# Patient Record
Sex: Female | Born: 1985 | Race: White | Hispanic: No | Marital: Married | Smoking: Current every day smoker
Health system: Southern US, Community
[De-identification: ages and names within clinical notes are randomized; demographics above are authoritative.]

## PROBLEM LIST (undated history)

## (undated) DIAGNOSIS — F988 Other specified behavioral and emotional disorders with onset usually occurring in childhood and adolescence: Secondary | ICD-10-CM

## (undated) DIAGNOSIS — Z309 Encounter for contraceptive management, unspecified: Secondary | ICD-10-CM

## (undated) DIAGNOSIS — F419 Anxiety disorder, unspecified: Secondary | ICD-10-CM

## (undated) HISTORY — DX: Encounter for contraceptive management, unspecified: Z30.9

## (undated) HISTORY — DX: Anxiety disorder, unspecified: F41.9

## (undated) HISTORY — DX: Other specified behavioral and emotional disorders with onset usually occurring in childhood and adolescence: F98.8

---
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
## 2005-08-21 ENCOUNTER — Inpatient Hospital Stay (HOSPITAL_COMMUNITY): Admission: AD | Admit: 2005-08-21 | Discharge: 2005-08-24 | Payer: Self-pay | Admitting: Obstetrics and Gynecology

## 2007-03-06 ENCOUNTER — Other Ambulatory Visit: Admission: RE | Admit: 2007-03-06 | Discharge: 2007-03-06 | Payer: Self-pay | Admitting: Obstetrics and Gynecology

## 2008-12-28 ENCOUNTER — Other Ambulatory Visit: Admission: RE | Admit: 2008-12-28 | Discharge: 2008-12-28 | Payer: Self-pay | Admitting: Obstetrics & Gynecology

## 2010-02-14 ENCOUNTER — Other Ambulatory Visit
Admission: RE | Admit: 2010-02-14 | Discharge: 2010-02-14 | Payer: Self-pay | Source: Home / Self Care | Admitting: Obstetrics & Gynecology
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
## 2010-06-17 NOTE — Group Therapy Note (Signed)
NAME:  NYLAH, BUTKUS NO.:  [id number]   MEDICAL RECORD NO.:  [id number]          PATIENT TYPE:  INP   LOCATION:  LDR1                          FACILITY:  [facility]   PHYSICIAN:  Lazaro Arms, M.D.   DATE OF BIRTH:  1986/01/09   DATE OF PROCEDURE:  08/22/2005  DATE OF DISCHARGE:                                   PROGRESS NOTE   DELIVERY NOTE:  Sameena's second epidural worked Agricultural consultant, and she was allowed to labor  down for quite some time.  She was noted to be at +3 station at  approximately 1630.  She brought the baby to about 50 cent worth showing  caput; however, she was unable to move it any more after that, just purely  lack of effort due to the epidural.  A vacuum extractor was offered and  accepted.  It was placed on the fetal vertex and after three contractions,  the baby delivered over a midline episiotomy.  The shoulders delivered  without difficulty.  There was a loose nuchal cord, which was not reduced.  Weight is 7 pounds 8 ounces.  Apgars are 9 and 9.  The birth time is 68.  Twenty units of Pitocin diluted in 1000 mL of lactated Ringer's was infused  rapidly IV.  The placenta separated spontaneously and delivered via  controlled cord traction and maternal pushing effort at 1708.  It was  inspected and appears to be intact with a three-vessel cord.  The vagina was  then inspected and no extensions or other lacerations were noted.  Xylocaine  1% 10 mL was injected into the very shallow midline episiotomy and it was  repaired using 2-0 Vicryl suture.  The epidural catheter was then removed  with the blue tip visualized as being intact.  This epidural was in from  about 1:30 p.m. until delivery.  Estimated blood loss 300 mL.      Jacklyn Shell, C.N.M.      Lazaro Arms, M.D.  Electronically Signed    FC/MEDQ  D:  08/22/2005  T:  08/23/2005  Job:  [id number]   cc:   [facility]
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
## 2010-06-17 NOTE — Op Note (Signed)
NAME:  Amy Hardin, Amy Hardin NO.:  [id number]   MEDICAL RECORD NO.:  [id number]          PATIENT TYPE:  INP   LOCATION:  LDR1                          FACILITY:  [facility]   PHYSICIAN:  Tilda Burrow, M.D. DATE OF BIRTH:  08/17/1985   DATE OF PROCEDURE:  08/22/2005  DATE OF DISCHARGE:                                 OPERATIVE REPORT   PROCEDURE PERFORMED:  Epidural catheter placement.   Continuous lumbar epidural catheter placed using loss of resistance  technique at L2-3 interspace.  The patient's back was prepped and draped in  standard fashion.  The epidural catheter inserted without difficulty.  The  patient had epidural placed at L2-3 interspace without difficulty.  The  patient had good analgesic effect.  The patient began to have symmetric  analgesic effect and will receive a 7 mL bolus and 14 mL per hour.      Tilda Burrow, M.D.  Electronically Signed     JVF/MEDQ  D:  08/22/2005  T:  08/22/2005  Job:  [id number]
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
## 2011-03-29 ENCOUNTER — Other Ambulatory Visit (HOSPITAL_COMMUNITY)
Admission: RE | Admit: 2011-03-29 | Discharge: 2011-03-29 | Disposition: A | Discharge status: Home / Self Care | Payer: BC Managed Care – PPO | Source: Ambulatory Visit | Attending: Obstetrics & Gynecology | Admitting: Obstetrics & Gynecology

## 2011-03-29 DIAGNOSIS — Z01419 Encounter for gynecological examination (general) (routine) without abnormal findings: Secondary | ICD-10-CM | POA: Insufficient documentation

## 2012-06-03 ENCOUNTER — Other Ambulatory Visit: Payer: Self-pay | Admitting: *Deleted

## 2012-06-04 MED ORDER — NORGESTIMATE-ETH ESTRADIOL 0.25-35 MG-MCG PO TABS: 1.0000 | ORAL_TABLET | Freq: Every day | ORAL | Status: DC

## 2012-06-05 NOTE — Telephone Encounter (Addendum)
Pt informed RX for Mononessa e-scribed to pharmacy.

## 2012-07-04 ENCOUNTER — Encounter: Payer: Self-pay | Admitting: Obstetrics & Gynecology

## 2012-07-04 ENCOUNTER — Ambulatory Visit (INDEPENDENT_AMBULATORY_CARE_PROVIDER_SITE_OTHER): Payer: BC Managed Care – PPO | Admitting: Obstetrics & Gynecology

## 2012-07-04 VITALS — BP 100/60 | Ht 63.0 in | Wt 104.0 lb

## 2012-07-04 DIAGNOSIS — Z01419 Encounter for gynecological examination (general) (routine) without abnormal findings: Secondary | ICD-10-CM

## 2012-07-04 MED ORDER — NORGESTIMATE-ETH ESTRADIOL 0.25-35 MG-MCG PO TABS: 1.0000 | ORAL_TABLET | Freq: Every day | ORAL | Status: DC

## 2012-07-04 NOTE — Progress Notes (Signed)
Patient ID: Amy Hardin, female   DOB: 06-20-1985, 27 y.o.   MRN: [id number] Subjective:     Amy Hardin is a 27 y.o. female here for a routine exam.  Patient's last menstrual period was 06/23/2012. No obstetric history on file. Current complaints: none.  Personal health questionnaire reviewed: no.   Gynecologic History Patient's last menstrual period was 06/23/2012. Contraception: OCP (estrogen/progesterone) Last Pap: 2013. Results were: normal Last mammogram: na. Results were: na  Obstetric History OB History   Grav Para Term Preterm Abortions TAB SAB Ect Mult Living                   The following portions of the patient's history were reviewed and updated as appropriate: allergies, current medications, past family history, past medical history, past social history, past surgical history and problem list.  Review of Systems  Review of Systems  Constitutional: Negative for fever, chills, weight loss, malaise/fatigue and diaphoresis.  HENT: Negative for hearing loss, ear pain, nosebleeds, congestion, sore throat, neck pain, tinnitus and ear discharge.   Eyes: Negative for blurred vision, double vision, photophobia, pain, discharge and redness.  Respiratory: Negative for cough, hemoptysis, sputum production, shortness of breath, wheezing and stridor.   Cardiovascular: Negative for chest pain, palpitations, orthopnea, claudication, leg swelling and PND.  Gastrointestinal: negative for abdominal pain. Negative for heartburn, nausea, vomiting, diarrhea, constipation, blood in stool and melena.  Genitourinary: Negative for dysuria, urgency, frequency, hematuria and flank pain.  Musculoskeletal: Negative for myalgias, back pain, joint pain and falls.  Skin: Negative for itching and rash.  Neurological: Negative for dizziness, tingling, tremors, sensory change, speech change, focal weakness, seizures, loss of consciousness, weakness and headaches.  Endo/Heme/Allergies: Negative  for environmental allergies and polydipsia. Does not bruise/bleed easily.  Psychiatric/Behavioral: Negative for depression, suicidal ideas, hallucinations, memory loss and substance abuse. The patient is not nervous/anxious and does not have insomnia.        Objective:    Physical Exam  Vitals reviewed. Constitutional: She is oriented to person, place, and time. She appears well-developed and well-nourished.  HENT:  Head: Normocephalic and atraumatic.        Right Ear: External ear normal.  Left Ear: External ear normal.  Nose: Nose normal.  Mouth/Throat: Oropharynx is clear and moist.  Eyes: Conjunctivae and EOM are normal. Pupils are equal, round, and reactive to light. Right eye exhibits no discharge. Left eye exhibits no discharge. No scleral icterus.  Neck: Normal range of motion. Neck supple. No tracheal deviation present. No thyromegaly present.  Cardiovascular: Normal rate, regular rhythm, normal heart sounds and intact distal pulses.  Exam reveals no gallop and no friction rub.   No murmur heard. Respiratory: Effort normal and breath sounds normal. No respiratory distress. She has no wheezes. She has no rales. She exhibits no tenderness.  GI: Soft. Bowel sounds are normal. She exhibits no distension and no mass. There is no tenderness. There is no rebound and no guarding.  Genitourinary:       Vulva is normal without lesions Vagina is pink moist without discharge Cervix normal in appearance and pap is done Uterus is normal size shape and contour Adnexa is negative with normal sized ovaries   Musculoskeletal: Normal range of motion. She exhibits no edema and no tenderness.  Neurological: She is alert and oriented to person, place, and time. She has normal reflexes. She displays normal reflexes. No cranial nerve deficit. She exhibits normal muscle tone. Coordination normal.  Skin: Skin  is warm and dry. No rash noted. No erythema. No pallor.  Psychiatric: She has a normal mood  and affect. Her behavior is normal. Judgment and thought content normal.       Assessment:    Healthy female exam.    Plan:    Contraception: OCP (estrogen/progesterone). Follow up in: 1 year.

## 2013-08-26 ENCOUNTER — Other Ambulatory Visit: Payer: Self-pay | Admitting: Obstetrics & Gynecology

## 2013-09-02 ENCOUNTER — Ambulatory Visit (INDEPENDENT_AMBULATORY_CARE_PROVIDER_SITE_OTHER): Payer: BC Managed Care – PPO | Admitting: Obstetrics & Gynecology

## 2013-09-02 ENCOUNTER — Encounter: Payer: Self-pay | Admitting: Obstetrics & Gynecology

## 2013-09-02 VITALS — BP 100/70 | Ht 63.2 in | Wt 105.0 lb

## 2013-09-02 DIAGNOSIS — Z01419 Encounter for gynecological examination (general) (routine) without abnormal findings: Secondary | ICD-10-CM

## 2013-09-02 MED ORDER — ALPRAZOLAM 0.5 MG PO TABS: 0.5000 mg | ORAL_TABLET | Freq: Three times a day (TID) | ORAL | Status: DC | PRN

## 2013-09-02 MED ORDER — NORGESTIMATE-ETH ESTRADIOL 0.25-35 MG-MCG PO TABS: ORAL_TABLET | ORAL | Status: DC

## 2013-09-02 NOTE — Progress Notes (Signed)
Patient ID: Amy PrimasBrianne D Hardin, female   DOB: September 23, 1985, 28 y.o.   MRN: [id number] Subjective:     Amy PrimasBrianne D Hardin is a 28 y.o. female here for a routine exam.  Patient's last menstrual period was 08/19/2013. No obstetric history on file. Birth Control Method:  OCP Menstrual Calendar(currently): regular  Current complaints: none.   Current acute medical issues:  none   Recent Gynecologic History Patient's last menstrual period was 08/19/2013. Last Pap: 2014,  normal Last mammogram: ,  normal  Past Medical History  Diagnosis Date  . Anxiety     History reviewed. No pertinent past surgical history.  OB History   Grav Para Term Preterm Abortions TAB SAB Ect Mult Living                  History   Social History  . Marital Status: Single    Spouse Name: N/A    Number of Children: N/A  . Years of Education: N/A   Social History Main Topics  . Smoking status: Current Every Day Smoker    Types: Cigarettes  . Smokeless tobacco: None  . Alcohol Use: None  . Drug Use: None  . Sexual Activity: Yes   Other Topics Concern  . None   Social History Narrative  . None    Family History  Problem Relation Age of Onset  . Diabetes Other   . Hypertension Other   . Heart disease Other      Review of Systems  Review of Systems  Constitutional: Negative for fever, chills, weight loss, malaise/fatigue and diaphoresis.  HENT: Negative for hearing loss, ear pain, nosebleeds, congestion, sore throat, neck pain, tinnitus and ear discharge.   Eyes: Negative for blurred vision, double vision, photophobia, pain, discharge and redness.  Respiratory: Negative for cough, hemoptysis, sputum production, shortness of breath, wheezing and stridor.   Cardiovascular: Negative for chest pain, palpitations, orthopnea, claudication, leg swelling and PND.  Gastrointestinal: negative for abdominal pain. Negative for heartburn, nausea, vomiting, diarrhea, constipation, blood in stool and melena.   Genitourinary: Negative for dysuria, urgency, frequency, hematuria and flank pain.  Musculoskeletal: Negative for myalgias, back pain, joint pain and falls.  Skin: Negative for itching and rash.  Neurological: Negative for dizziness, tingling, tremors, sensory change, speech change, focal weakness, seizures, loss of consciousness, weakness and headaches.  Endo/Heme/Allergies: Negative for environmental allergies and polydipsia. Does not bruise/bleed easily.  Psychiatric/Behavioral: Negative for depression, suicidal ideas, hallucinations, memory loss and substance abuse. The patient is not nervous/anxious and does not have insomnia.        Objective:    Physical Exam  Vitals reviewed. Constitutional: She is oriented to person, place, and time. She appears well-developed and well-nourished.  HENT:  Head: Normocephalic and atraumatic.        Right Ear: External ear normal.  Left Ear: External ear normal.  Nose: Nose normal.  Mouth/Throat: Oropharynx is clear and moist.  Eyes: Conjunctivae and EOM are normal. Pupils are equal, round, and reactive to light. Right eye exhibits no discharge. Left eye exhibits no discharge. No scleral icterus.  Neck: Normal range of motion. Neck supple. No tracheal deviation present. No thyromegaly present.  Cardiovascular: Normal rate, regular rhythm, normal heart sounds and intact distal pulses.  Exam reveals no gallop and no friction rub.   No murmur heard. Respiratory: Effort normal and breath sounds normal. No respiratory distress. She has no wheezes. She has no rales. She exhibits no tenderness.  GI: Soft. Bowel sounds are  normal. She exhibits no distension and no mass. There is no tenderness. There is no rebound and no guarding.  Genitourinary:  Breasts no masses skin changes or nipple changes bilaterally      Vulva is normal without lesions Vagina is pink moist without discharge Cervix normal in appearance and pap is done Uterus is normal size shape  and contour Adnexa is negative with normal sized ovaries    Musculoskeletal: Normal range of motion. She exhibits no edema and no tenderness.  Neurological: She is alert and oriented to person, place, and time. She has normal reflexes. She displays normal reflexes. No cranial nerve deficit. She exhibits normal muscle tone. Coordination normal.  Skin: Skin is warm and dry. No rash noted. No erythema. No pallor.  Psychiatric: She has a normal mood and affect. Her behavior is normal. Judgment and thought content normal.       Assessment:    Healthy female exam.    Plan:    Contraception: OCP (estrogen/progesterone). Follow up in: 1 year.

## 2013-09-02 NOTE — Addendum Note (Signed)
Addended by: Criss AlvinePULLIAM, CHRYSTAL G on: 09/02/2013 04:26 PM   Modules accepted: Orders

## 2013-09-03 ENCOUNTER — Other Ambulatory Visit (HOSPITAL_COMMUNITY)
Admission: RE | Admit: 2013-09-03 | Discharge: 2013-09-03 | Disposition: A | Discharge status: Home / Self Care | Payer: BC Managed Care – PPO | Source: Ambulatory Visit | Attending: Obstetrics & Gynecology | Admitting: Obstetrics & Gynecology

## 2013-09-03 DIAGNOSIS — Z01419 Encounter for gynecological examination (general) (routine) without abnormal findings: Secondary | ICD-10-CM | POA: Insufficient documentation

## 2013-09-04 LAB — CYTOLOGY - PAP

## 2014-09-22 ENCOUNTER — Other Ambulatory Visit: Payer: Self-pay | Admitting: Obstetrics & Gynecology

## 2014-09-29 ENCOUNTER — Encounter: Payer: Self-pay | Admitting: Adult Health

## 2014-09-29 ENCOUNTER — Ambulatory Visit (INDEPENDENT_AMBULATORY_CARE_PROVIDER_SITE_OTHER): Payer: BLUE CROSS/BLUE SHIELD | Admitting: Adult Health

## 2014-09-29 VITALS — BP 104/70 | HR 68 | Ht 63.5 in | Wt 104.5 lb

## 2014-09-29 DIAGNOSIS — Z309 Encounter for contraceptive management, unspecified: Secondary | ICD-10-CM

## 2014-09-29 DIAGNOSIS — Z3041 Encounter for surveillance of contraceptive pills: Secondary | ICD-10-CM

## 2014-09-29 DIAGNOSIS — Z01419 Encounter for gynecological examination (general) (routine) without abnormal findings: Secondary | ICD-10-CM | POA: Diagnosis not present

## 2014-09-29 HISTORY — DX: Encounter for contraceptive management, unspecified: Z30.9

## 2014-09-29 MED ORDER — NORGESTIMATE-ETH ESTRADIOL 0.25-35 MG-MCG PO TABS: ORAL_TABLET | ORAL | Status: DC

## 2014-09-29 NOTE — Patient Instructions (Signed)
Pap and physical in 1 year Mammogram at 40   

## 2014-09-29 NOTE — Progress Notes (Signed)
Patient ID: Amy Hardin, female   DOB: January 11, 1986, 29 y.o.   MRN: [id number] History of Present Illness: Amy Hardin is a 29 year old white female in for a well woman gyn exam, her last pap was 09/02/13 and was normal.She is happy with her pills and has no complaints.   Current Medications, Allergies, Past Medical History, Past Surgical History, Family History and Social History were reviewed in [employer] record.     Review of Systems: Patient denies any headaches, hearing loss, fatigue, blurred vision, shortness of breath, chest pain, abdominal pain, problems with bowel movements, urination, or intercourse. No joint pain or mood swings.   Physical Exam:BP 104/70 mmHg  Pulse 68  Ht 5' 3.5" (1.613 m)  Wt 104 lb 8 oz (47.401 kg)  BMI 18.22 kg/m2  LMP 09/22/2014 General:  Well developed, well nourished, no acute distress Skin:  Warm and dry Neck:  Midline trachea, normal thyroid, good ROM, no lymphadenopathy Lungs; Clear to auscultation bilaterally Breast:  No dominant palpable mass, retraction, or nipple discharge Cardiovascular: Regular rate and rhythm Abdomen:  Soft, non tender, no hepatosplenomegaly Pelvic:  External genitalia is normal in appearance, no lesions.  The vagina is normal in appearance. Urethra has no lesions or masses. The cervix is smooth.  Uterus is felt to be normal size, shape, and contour.  No adnexal masses or tenderness noted.Bladder is non tender, no masses felt. Extremities/musculoskeletal:  No swelling or varicosities noted, no clubbing or cyanosis Psych:  No mood changes, alert and cooperative,seems happy Decrease cigarettes, she is aware can't take COC and smoke past 35 can switch to POP  Impression: Well woman gyn exam no pap Contraceptive management    Plan: Refilled Mononessa take 1 daily x 1 year Pap and physical in 1 year Mammogram at 9

## 2015-07-20 DIAGNOSIS — F419 Anxiety disorder, unspecified: Secondary | ICD-10-CM | POA: Diagnosis not present

## 2015-07-20 DIAGNOSIS — Z1389 Encounter for screening for other disorder: Secondary | ICD-10-CM | POA: Diagnosis not present

## 2015-07-20 DIAGNOSIS — Z681 Body mass index (BMI) 19 or less, adult: Secondary | ICD-10-CM | POA: Diagnosis not present

## 2015-10-05 ENCOUNTER — Ambulatory Visit (INDEPENDENT_AMBULATORY_CARE_PROVIDER_SITE_OTHER): Payer: BLUE CROSS/BLUE SHIELD | Admitting: Obstetrics & Gynecology

## 2015-10-05 ENCOUNTER — Encounter: Payer: Self-pay | Admitting: Obstetrics & Gynecology

## 2015-10-05 ENCOUNTER — Other Ambulatory Visit (HOSPITAL_COMMUNITY)
Admission: RE | Admit: 2015-10-05 | Discharge: 2015-10-05 | Disposition: A | Discharge status: Home / Self Care | Payer: BLUE CROSS/BLUE SHIELD | Source: Ambulatory Visit | Attending: Obstetrics & Gynecology | Admitting: Obstetrics & Gynecology

## 2015-10-05 VITALS — BP 100/60 | HR 64 | Ht 64.0 in | Wt 108.0 lb

## 2015-10-05 DIAGNOSIS — Z1151 Encounter for screening for human papillomavirus (HPV): Secondary | ICD-10-CM | POA: Diagnosis not present

## 2015-10-05 DIAGNOSIS — Z01419 Encounter for gynecological examination (general) (routine) without abnormal findings: Secondary | ICD-10-CM

## 2015-10-05 MED ORDER — NORGESTIMATE-ETH ESTRADIOL 0.25-35 MG-MCG PO TABS: ORAL_TABLET | ORAL | 12 refills | Status: DC

## 2015-10-05 NOTE — Progress Notes (Signed)
Subjective:     Amy PrimasBrianne D Hardin is a [age] y.o. female here for a routine exam.  Patient's last menstrual period was 09/21/2015. G1P1 Birth Control Method:  OCP Menstrual Calendar(currently): regular  Current complaints: none.   Current acute medical issues:  none   Recent Gynecologic History Patient's last menstrual period was 09/21/2015. Last Pap: 2016,  normal Last mammogram: ,    Past Medical History:  Diagnosis Date  . Anxiety   . Contraceptive management 09/29/2014    History reviewed. No pertinent surgical history.  OB History    Gravida Para Term Preterm AB Living   1 1       1    SAB TAB Ectopic Multiple Live Births                  Social History   Social History  . Marital status: Single    Spouse name: N/A  . Number of children: N/A  . Years of education: N/A   Social History Main Topics  . Smoking status: Current Every Day Smoker    Packs/day: 1.00    Years: 13.00    Types: Cigarettes  . Smokeless tobacco: Never Used  . Alcohol use No  . Drug use: No  . Sexual activity: Yes    Birth control/ protection: Pill   Other Topics Concern  . None   Social History Narrative  . None    Family History  Problem Relation Age of Onset  . Cancer Maternal Grandmother   . Cancer Maternal Grandfather   . Diabetes Maternal Grandfather   . COPD Paternal Grandmother   . Congestive Heart Failure Paternal Grandfather   . Diabetes Paternal Grandfather   . Heart disease Paternal Grandfather   . Hypertension Paternal Grandfather      Current Outpatient Prescriptions:  .  ALPRAZolam (XANAX) 0.5 MG tablet, Take 1 tablet (0.5 mg total) by mouth 3 (three) times daily as needed for anxiety., Disp: 90 tablet, Rfl: 3 .  norgestimate-ethinyl estradiol (MONONESSA) 0.25-35 MG-MCG tablet, TAKE ONE (1) TABLET EACH DAY, Disp: 1 Package, Rfl: 12  Review of Systems  Review of Systems  Constitutional: Negative for fever, chills, weight loss, malaise/fatigue and  diaphoresis.  HENT: Negative for hearing loss, ear pain, nosebleeds, congestion, sore throat, neck pain, tinnitus and ear discharge.   Eyes: Negative for blurred vision, double vision, photophobia, pain, discharge and redness.  Respiratory: Negative for cough, hemoptysis, sputum production, shortness of breath, wheezing and stridor.   Cardiovascular: Negative for chest pain, palpitations, orthopnea, claudication, leg swelling and PND.  Gastrointestinal: negative for abdominal pain. Negative for heartburn, nausea, vomiting, diarrhea, constipation, blood in stool and melena.  Genitourinary: Negative for dysuria, urgency, frequency, hematuria and flank pain.  Musculoskeletal: Negative for myalgias, back pain, joint pain and falls.  Skin: Negative for itching and rash.  Neurological: Negative for dizziness, tingling, tremors, sensory change, speech change, focal weakness, seizures, loss of consciousness, weakness and headaches.  Endo/Heme/Allergies: Negative for environmental allergies and polydipsia. Does not bruise/bleed easily.  Psychiatric/Behavioral: Negative for depression, suicidal ideas, hallucinations, memory loss and substance abuse. The patient is not nervous/anxious and does not have insomnia.        Objective:  Blood pressure 100/60, pulse 64, height 5\' 4"  (1.626 m), weight 108 lb (49 kg), last menstrual period 09/21/2015.   Physical Exam  Vitals reviewed. Constitutional: She is oriented to person, place, and time. She appears well-developed and well-nourished.  HENT:  Head: Normocephalic and atraumatic.  Right Ear: External ear normal.  Left Ear: External ear normal.  Nose: Nose normal.  Mouth/Throat: Oropharynx is clear and moist.  Eyes: Conjunctivae and EOM are normal. Pupils are equal, round, and reactive to light. Right eye exhibits no discharge. Left eye exhibits no discharge. No scleral icterus.  Neck: Normal range of motion. Neck supple. No tracheal deviation  present. No thyromegaly present.  Cardiovascular: Normal rate, regular rhythm, normal heart sounds and intact distal pulses.  Exam reveals no gallop and no friction rub.   No murmur heard. Respiratory: Effort normal and breath sounds normal. No respiratory distress. She has no wheezes. She has no rales. She exhibits no tenderness.  GI: Soft. Bowel sounds are normal. She exhibits no distension and no mass. There is no tenderness. There is no rebound and no guarding.  Genitourinary:  Breasts no masses skin changes or nipple changes bilaterally      Vulva is normal without lesions Vagina is pink moist without discharge Cervix normal in appearance and pap is done Uterus is normal size shape and contour Adnexa is negative with normal sized ovaries   Musculoskeletal: Normal range of motion. She exhibits no edema and no tenderness.  Neurological: She is alert and oriented to person, place, and time. She has normal reflexes. She displays normal reflexes. No cranial nerve deficit. She exhibits normal muscle tone. Coordination normal.  Skin: Skin is warm and dry. No rash noted. No erythema. No pallor.  Psychiatric: She has a normal mood and affect. Her behavior is normal. Judgment and thought content normal.       Medications Ordered at today's visit: Meds ordered this encounter  Medications  . norgestimate-ethinyl estradiol (MONONESSA) 0.25-35 MG-MCG tablet    Sig: TAKE ONE (1) TABLET EACH DAY    Dispense:  1 Package    Refill:  12    Other orders placed at today's visit: No orders of the defined types were placed in this encounter.     Assessment:    Healthy female exam.    Plan:    Contraception: OCP (estrogen/progesterone). Follow up in: 1 year.     Return in about 1 year (around 10/04/2016) for yearly, with Dr Despina Hidden.

## 2015-10-07 LAB — CYTOLOGY - PAP

## 2015-11-09 DIAGNOSIS — F419 Anxiety disorder, unspecified: Secondary | ICD-10-CM | POA: Diagnosis not present

## 2015-11-09 DIAGNOSIS — M779 Enthesopathy, unspecified: Secondary | ICD-10-CM | POA: Diagnosis not present

## 2015-11-09 DIAGNOSIS — E782 Mixed hyperlipidemia: Secondary | ICD-10-CM | POA: Diagnosis not present

## 2015-11-09 DIAGNOSIS — Z681 Body mass index (BMI) 19 or less, adult: Secondary | ICD-10-CM | POA: Diagnosis not present

## 2015-11-09 DIAGNOSIS — Z1389 Encounter for screening for other disorder: Secondary | ICD-10-CM | POA: Diagnosis not present

## 2015-12-20 DIAGNOSIS — M25512 Pain in left shoulder: Secondary | ICD-10-CM | POA: Diagnosis not present

## 2015-12-20 DIAGNOSIS — M542 Cervicalgia: Secondary | ICD-10-CM | POA: Diagnosis not present

## 2016-03-21 DIAGNOSIS — Z681 Body mass index (BMI) 19 or less, adult: Secondary | ICD-10-CM | POA: Diagnosis not present

## 2016-03-21 DIAGNOSIS — F419 Anxiety disorder, unspecified: Secondary | ICD-10-CM | POA: Diagnosis not present

## 2016-03-21 DIAGNOSIS — F17290 Nicotine dependence, other tobacco product, uncomplicated: Secondary | ICD-10-CM | POA: Diagnosis not present

## 2016-03-21 DIAGNOSIS — Z719 Counseling, unspecified: Secondary | ICD-10-CM | POA: Diagnosis not present

## 2016-03-21 DIAGNOSIS — Z1389 Encounter for screening for other disorder: Secondary | ICD-10-CM | POA: Diagnosis not present

## 2016-06-12 DIAGNOSIS — Z1389 Encounter for screening for other disorder: Secondary | ICD-10-CM | POA: Diagnosis not present

## 2016-06-12 DIAGNOSIS — Z681 Body mass index (BMI) 19 or less, adult: Secondary | ICD-10-CM | POA: Diagnosis not present

## 2016-06-12 DIAGNOSIS — F419 Anxiety disorder, unspecified: Secondary | ICD-10-CM | POA: Diagnosis not present

## 2016-09-19 ENCOUNTER — Other Ambulatory Visit: Payer: Self-pay | Admitting: Obstetrics & Gynecology

## 2016-10-10 DIAGNOSIS — F419 Anxiety disorder, unspecified: Secondary | ICD-10-CM | POA: Diagnosis not present

## 2016-10-10 DIAGNOSIS — Z681 Body mass index (BMI) 19 or less, adult: Secondary | ICD-10-CM | POA: Diagnosis not present

## 2016-10-10 DIAGNOSIS — Z1389 Encounter for screening for other disorder: Secondary | ICD-10-CM | POA: Diagnosis not present

## 2016-11-21 ENCOUNTER — Other Ambulatory Visit: Payer: BLUE CROSS/BLUE SHIELD | Admitting: Obstetrics & Gynecology

## 2017-02-06 ENCOUNTER — Ambulatory Visit: Payer: BLUE CROSS/BLUE SHIELD | Admitting: Obstetrics & Gynecology

## 2017-02-06 ENCOUNTER — Encounter: Payer: Self-pay | Admitting: Obstetrics & Gynecology

## 2017-02-06 ENCOUNTER — Encounter (INDEPENDENT_AMBULATORY_CARE_PROVIDER_SITE_OTHER): Payer: Self-pay

## 2017-02-06 ENCOUNTER — Other Ambulatory Visit (HOSPITAL_COMMUNITY)
Admission: RE | Admit: 2017-02-06 | Discharge: 2017-02-06 | Disposition: A | Discharge status: Home / Self Care | Payer: BLUE CROSS/BLUE SHIELD | Source: Ambulatory Visit | Attending: Obstetrics & Gynecology | Admitting: Obstetrics & Gynecology

## 2017-02-06 VITALS — BP 118/58 | Ht 64.0 in | Wt 109.5 lb

## 2017-02-06 DIAGNOSIS — Z01419 Encounter for gynecological examination (general) (routine) without abnormal findings: Secondary | ICD-10-CM | POA: Insufficient documentation

## 2017-02-06 MED ORDER — ALPRAZOLAM 0.5 MG PO TABS: 0.5000 mg | ORAL_TABLET | Freq: Three times a day (TID) | ORAL | 3 refills | Status: DC | PRN

## 2017-02-06 MED ORDER — NORGESTIMATE-ETH ESTRADIOL 0.25-35 MG-MCG PO TABS: ORAL_TABLET | ORAL | 12 refills | Status: DC

## 2017-02-06 NOTE — Progress Notes (Signed)
Subjective:     Amy Hardin is a 32 y.o. female here for a routine exam.  Patient's last menstrual period was 02/02/2017 (exact date). G1P1 Birth Control Method:  OCP Menstrual Calendar(currently): regular  Current complaints: none.   Current acute medical issues:  none   Recent Gynecologic History Patient's last menstrual period was 02/02/2017 (exact date). Last Pap: 2018,  normal Last mammogram: ,    Past Medical History:  Diagnosis Date  . Anxiety   . Contraceptive management 09/29/2014    History reviewed. No pertinent surgical history.  OB History    Gravida Para Term Preterm AB Living   1 1       1    SAB TAB Ectopic Multiple Live Births                  Social History   Socioeconomic History  . Marital status: Single    Spouse name: None  . Number of children: None  . Years of education: None  . Highest education level: None  Social Needs  . Financial resource strain: None  . Food insecurity - worry: None  . Food insecurity - inability: None  . Transportation needs - medical: None  . Transportation needs - non-medical: None  Occupational History  . None  Tobacco Use  . Smoking status: Current Every Day Smoker    Packs/day: 1.00    Years: 13.00    Pack years: 13.00    Types: Cigarettes  . Smokeless tobacco: Never Used  Substance and Sexual Activity  . Alcohol use: No  . Drug use: No  . Sexual activity: Yes    Birth control/protection: Pill  Other Topics Concern  . None  Social History Narrative  . None    Family History  Problem Relation Age of Onset  . Cancer Maternal Grandmother   . Cancer Maternal Grandfather   . Diabetes Maternal Grandfather   . COPD Paternal Grandmother   . Congestive Heart Failure Paternal Grandfather   . Diabetes Paternal Grandfather   . Heart disease Paternal Grandfather   . Hypertension Paternal Grandfather      Current Outpatient Medications:  .  ALPRAZolam (XANAX) 0.5 MG tablet, Take 1 tablet (0.5 mg  total) by mouth 3 (three) times daily as needed for anxiety., Disp: 90 tablet, Rfl: 3 .  norgestimate-ethinyl estradiol (ORTHO-CYCLEN,SPRINTEC,PREVIFEM) 0.25-35 MG-MCG tablet, TAKE ONE (1) TABLET EACH DAY, Disp: 1 Package, Rfl: 12  Review of Systems  Review of Systems  Constitutional: Negative for fever, chills, weight loss, malaise/fatigue and diaphoresis.  HENT: Negative for hearing loss, ear pain, nosebleeds, congestion, sore throat, neck pain, tinnitus and ear discharge.   Eyes: Negative for blurred vision, double vision, photophobia, pain, discharge and redness.  Respiratory: Negative for cough, hemoptysis, sputum production, shortness of breath, wheezing and stridor.   Cardiovascular: Negative for chest pain, palpitations, orthopnea, claudication, leg swelling and PND.  Gastrointestinal: negative for abdominal pain. Negative for heartburn, nausea, vomiting, diarrhea, constipation, blood in stool and melena.  Genitourinary: Negative for dysuria, urgency, frequency, hematuria and flank pain.  Musculoskeletal: Negative for myalgias, back pain, joint pain and falls.  Skin: Negative for itching and rash.  Neurological: Negative for dizziness, tingling, tremors, sensory change, speech change, focal weakness, seizures, loss of consciousness, weakness and headaches.  Endo/Heme/Allergies: Negative for environmental allergies and polydipsia. Does not bruise/bleed easily.  Psychiatric/Behavioral: Negative for depression, suicidal ideas, hallucinations, memory loss and substance abuse. The patient is not nervous/anxious and does not have insomnia.  Objective:  Blood pressure (!) 118/58, height 5\' 4"  (1.626 m), weight 109 lb 8 oz (49.7 kg), last menstrual period 02/02/2017.   Physical Exam  Vitals reviewed. Constitutional: She is oriented to person, place, and time. She appears well-developed and well-nourished.  HENT:  Head: Normocephalic and atraumatic.        Right Ear: External ear  normal.  Left Ear: External ear normal.  Nose: Nose normal.  Mouth/Throat: Oropharynx is clear and moist.  Eyes: Conjunctivae and EOM are normal. Pupils are equal, round, and reactive to light. Right eye exhibits no discharge. Left eye exhibits no discharge. No scleral icterus.  Neck: Normal range of motion. Neck supple. No tracheal deviation present. No thyromegaly present.  Cardiovascular: Normal rate, regular rhythm, normal heart sounds and intact distal pulses.  Exam reveals no gallop and no friction rub.   No murmur heard. Respiratory: Effort normal and breath sounds normal. No respiratory distress. She has no wheezes. She has no rales. She exhibits no tenderness.  GI: Soft. Bowel sounds are normal. She exhibits no distension and no mass. There is no tenderness. There is no rebound and no guarding.  Genitourinary:  Breasts no masses skin changes or nipple changes bilaterally      Vulva is normal without lesions Vagina is pink moist without discharge Cervix normal in appearance and pap is done Uterus is normal size shape and contour Adnexa is negative with normal sized ovaries   Musculoskeletal: Normal range of motion. She exhibits no edema and no tenderness.  Neurological: She is alert and oriented to person, place, and time. She has normal reflexes. She displays normal reflexes. No cranial nerve deficit. She exhibits normal muscle tone. Coordination normal.  Skin: Skin is warm and dry. No rash noted. No erythema. No pallor.  Psychiatric: She has a normal mood and affect. Her behavior is normal. Judgment and thought content normal.       Medications Ordered at today's visit: Meds ordered this encounter  Medications  . norgestimate-ethinyl estradiol (ORTHO-CYCLEN,SPRINTEC,PREVIFEM) 0.25-35 MG-MCG tablet    Sig: TAKE ONE (1) TABLET EACH DAY    Dispense:  1 Package    Refill:  12  . ALPRAZolam (XANAX) 0.5 MG tablet    Sig: Take 1 tablet (0.5 mg total) by mouth 3 (three) times daily  as needed for anxiety.    Dispense:  90 tablet    Refill:  3    Other orders placed at today's visit: No orders of the defined types were placed in this encounter.     Assessment:    Healthy female exam.    Plan:    Contraception: OCP (estrogen/progesterone). Follow up in: 1 year.     Return in about 1 year (around 02/06/2018) for yearly.

## 2017-02-08 LAB — CYTOLOGY - PAP
Diagnosis: NEGATIVE
HPV: NOT DETECTED

## 2017-06-26 ENCOUNTER — Other Ambulatory Visit: Payer: Self-pay | Admitting: Obstetrics & Gynecology

## 2017-07-10 DIAGNOSIS — Z1389 Encounter for screening for other disorder: Secondary | ICD-10-CM | POA: Diagnosis not present

## 2017-07-10 DIAGNOSIS — F419 Anxiety disorder, unspecified: Secondary | ICD-10-CM | POA: Diagnosis not present

## 2017-07-10 DIAGNOSIS — Z681 Body mass index (BMI) 19 or less, adult: Secondary | ICD-10-CM | POA: Diagnosis not present

## 2017-11-20 DIAGNOSIS — F410 Panic disorder [episodic paroxysmal anxiety] without agoraphobia: Secondary | ICD-10-CM | POA: Diagnosis not present

## 2017-11-20 DIAGNOSIS — Z1389 Encounter for screening for other disorder: Secondary | ICD-10-CM | POA: Diagnosis not present

## 2017-11-20 DIAGNOSIS — Z681 Body mass index (BMI) 19 or less, adult: Secondary | ICD-10-CM | POA: Diagnosis not present

## 2017-11-20 DIAGNOSIS — R0600 Dyspnea, unspecified: Secondary | ICD-10-CM | POA: Diagnosis not present

## 2017-11-20 DIAGNOSIS — F419 Anxiety disorder, unspecified: Secondary | ICD-10-CM | POA: Diagnosis not present

## 2017-11-21 ENCOUNTER — Ambulatory Visit (HOSPITAL_COMMUNITY)
Admission: RE | Admit: 2017-11-21 | Discharge: 2017-11-21 | Disposition: A | Discharge status: Home / Self Care | Payer: BLUE CROSS/BLUE SHIELD | Source: Ambulatory Visit | Attending: Internal Medicine | Admitting: Internal Medicine

## 2017-11-21 ENCOUNTER — Other Ambulatory Visit (HOSPITAL_COMMUNITY): Payer: Self-pay | Admitting: Internal Medicine

## 2017-11-21 DIAGNOSIS — R0600 Dyspnea, unspecified: Secondary | ICD-10-CM | POA: Insufficient documentation

## 2017-11-21 DIAGNOSIS — Z1389 Encounter for screening for other disorder: Secondary | ICD-10-CM | POA: Diagnosis not present

## 2017-12-04 DIAGNOSIS — F410 Panic disorder [episodic paroxysmal anxiety] without agoraphobia: Secondary | ICD-10-CM | POA: Diagnosis not present

## 2017-12-04 DIAGNOSIS — Z1389 Encounter for screening for other disorder: Secondary | ICD-10-CM | POA: Diagnosis not present

## 2017-12-04 DIAGNOSIS — Z681 Body mass index (BMI) 19 or less, adult: Secondary | ICD-10-CM | POA: Diagnosis not present

## 2017-12-04 DIAGNOSIS — F419 Anxiety disorder, unspecified: Secondary | ICD-10-CM | POA: Diagnosis not present

## 2018-03-05 DIAGNOSIS — R509 Fever, unspecified: Secondary | ICD-10-CM | POA: Diagnosis not present

## 2018-03-05 DIAGNOSIS — R05 Cough: Secondary | ICD-10-CM | POA: Diagnosis not present

## 2018-03-05 DIAGNOSIS — R070 Pain in throat: Secondary | ICD-10-CM | POA: Diagnosis not present

## 2018-03-05 DIAGNOSIS — J209 Acute bronchitis, unspecified: Secondary | ICD-10-CM | POA: Diagnosis not present

## 2018-04-09 DIAGNOSIS — F419 Anxiety disorder, unspecified: Secondary | ICD-10-CM | POA: Diagnosis not present

## 2018-04-09 DIAGNOSIS — Z1389 Encounter for screening for other disorder: Secondary | ICD-10-CM | POA: Diagnosis not present

## 2018-04-09 DIAGNOSIS — Z681 Body mass index (BMI) 19 or less, adult: Secondary | ICD-10-CM | POA: Diagnosis not present

## 2018-04-23 ENCOUNTER — Other Ambulatory Visit: Payer: Self-pay | Admitting: Obstetrics & Gynecology

## 2018-06-18 ENCOUNTER — Other Ambulatory Visit: Payer: BLUE CROSS/BLUE SHIELD | Admitting: Obstetrics & Gynecology

## 2018-07-08 ENCOUNTER — Encounter: Payer: Self-pay | Admitting: *Deleted

## 2018-07-09 ENCOUNTER — Encounter: Payer: Self-pay | Admitting: Obstetrics & Gynecology

## 2018-07-09 ENCOUNTER — Other Ambulatory Visit (HOSPITAL_COMMUNITY)
Admission: RE | Admit: 2018-07-09 | Discharge: 2018-07-09 | Disposition: A | Discharge status: Home / Self Care | Payer: BC Managed Care – PPO | Source: Ambulatory Visit | Attending: Obstetrics & Gynecology | Admitting: Obstetrics & Gynecology

## 2018-07-09 ENCOUNTER — Other Ambulatory Visit: Payer: Self-pay

## 2018-07-09 ENCOUNTER — Ambulatory Visit (INDEPENDENT_AMBULATORY_CARE_PROVIDER_SITE_OTHER): Payer: BC Managed Care – PPO | Admitting: Obstetrics & Gynecology

## 2018-07-09 VITALS — BP 106/71 | HR 79 | Ht 63.0 in | Wt 108.0 lb

## 2018-07-09 DIAGNOSIS — Z01419 Encounter for gynecological examination (general) (routine) without abnormal findings: Secondary | ICD-10-CM | POA: Diagnosis not present

## 2018-07-09 MED ORDER — NORGESTIMATE-ETH ESTRADIOL 0.25-35 MG-MCG PO TABS: ORAL_TABLET | ORAL | 12 refills | Status: DC

## 2018-07-09 NOTE — Progress Notes (Signed)
Subjective:     Amy Hardin is a 33 y.o. female here for a routine exam.  Patient's last menstrual period was 06/21/2018. G1P1 Birth Control Method:  OCP Menstrual Calendar(currently): regualr  Current complaints: none.   Current acute medical issues:  none   Recent Gynecologic History Patient's last menstrual period was 06/21/2018. Last Pap: 2019,  normal Last mammogram: ,    Past Medical History:  Diagnosis Date  . Anxiety   . Contraceptive management 09/29/2014    No past surgical history on file.  OB History    Gravida  1   Para  1   Term      Preterm      AB      Living  1     SAB      TAB      Ectopic      Multiple      Live Births  1           Social History   Socioeconomic History  . Marital status: Single    Spouse name: Not on file  . Number of children: 1  . Years of education: Not on file  . Highest education level: Not on file  Occupational History  . Not on file  Social Needs  . Financial resource strain: Not on file  . Food insecurity:    Worry: Not on file    Inability: Not on file  . Transportation needs:    Medical: Not on file    Non-medical: Not on file  Tobacco Use  . Smoking status: Current Every Day Smoker    Packs/day: 1.00    Years: 13.00    Pack years: 13.00    Types: Cigarettes  . Smokeless tobacco: Never Used  Substance and Sexual Activity  . Alcohol use: No  . Drug use: No  . Sexual activity: Yes    Birth control/protection: Pill  Lifestyle  . Physical activity:    Days per week: Not on file    Minutes per session: Not on file  . Stress: Not on file  Relationships  . Social connections:    Talks on phone: Not on file    Gets together: Not on file    Attends religious service: Not on file    Active member of club or organization: Not on file    Attends meetings of clubs or organizations: Not on file    Relationship status: Not on file  Other Topics Concern  . Not on file  Social History  Narrative  . Not on file    Family History  Problem Relation Age of Onset  . Cancer Maternal Grandmother   . Cancer Maternal Grandfather   . Diabetes Maternal Grandfather   . COPD Paternal Grandmother   . Congestive Heart Failure Paternal Grandfather   . Diabetes Paternal Grandfather   . Heart disease Paternal Grandfather   . Hypertension Paternal Grandfather      Current Outpatient Medications:  .  ALPRAZolam (XANAX) 0.5 MG tablet, TAKE 1 TABLET 3 TIMES DAILY AS NEEDED FOR ANXIETY, Disp: 90 tablet, Rfl: 4 .  norgestimate-ethinyl estradiol (ORTHO-CYCLEN) 0.25-35 MG-MCG tablet, I tablet daily, Disp: 1 Package, Rfl: 12  Review of Systems  Review of Systems  Constitutional: Negative for fever, chills, weight loss, malaise/fatigue and diaphoresis.  HENT: Negative for hearing loss, ear pain, nosebleeds, congestion, sore throat, neck pain, tinnitus and ear discharge.   Eyes: Negative for blurred vision, double vision, photophobia, pain,  discharge and redness.  Respiratory: Negative for cough, hemoptysis, sputum production, shortness of breath, wheezing and stridor.   Cardiovascular: Negative for chest pain, palpitations, orthopnea, claudication, leg swelling and PND.  Gastrointestinal: negative for abdominal pain. Negative for heartburn, nausea, vomiting, diarrhea, constipation, blood in stool and melena.  Genitourinary: Negative for dysuria, urgency, frequency, hematuria and flank pain.  Musculoskeletal: Negative for myalgias, back pain, joint pain and falls.  Skin: Negative for itching and rash.  Neurological: Negative for dizziness, tingling, tremors, sensory change, speech change, focal weakness, seizures, loss of consciousness, weakness and headaches.  Endo/Heme/Allergies: Negative for environmental allergies and polydipsia. Does not bruise/bleed easily.  Psychiatric/Behavioral: Negative for depression, suicidal ideas, hallucinations, memory loss and substance abuse. The patient is  not nervous/anxious and does not have insomnia.        Objective:  Blood pressure 106/71, pulse 79, height 5\' 3"  (1.6 m), weight 108 lb (49 kg), last menstrual period 06/21/2018.   Physical Exam  Vitals reviewed. Constitutional: She is oriented to person, place, and time. She appears well-developed and well-nourished.  HENT:  Head: Normocephalic and atraumatic.        Right Ear: External ear normal.  Left Ear: External ear normal.  Nose: Nose normal.  Mouth/Throat: Oropharynx is clear and moist.  Eyes: Conjunctivae and EOM are normal. Pupils are equal, round, and reactive to light. Right eye exhibits no discharge. Left eye exhibits no discharge. No scleral icterus.  Neck: Normal range of motion. Neck supple. No tracheal deviation present. No thyromegaly present.  Cardiovascular: Normal rate, regular rhythm, normal heart sounds and intact distal pulses.  Exam reveals no gallop and no friction rub.   No murmur heard. Respiratory: Effort normal and breath sounds normal. No respiratory distress. She has no wheezes. She has no rales. She exhibits no tenderness.  GI: Soft. Bowel sounds are normal. She exhibits no distension and no mass. There is no tenderness. There is no rebound and no guarding.  Genitourinary:  Breasts no masses skin changes or nipple changes bilaterally      Vulva is normal without lesions Vagina is pink moist without discharge Cervix normal in appearance and pap is done Uterus is normal size shape and contour Adnexa is negative with normal sized ovaries   Musculoskeletal: Normal range of motion. She exhibits no edema and no tenderness.  Neurological: She is alert and oriented to person, place, and time. She has normal reflexes. She displays normal reflexes. No cranial nerve deficit. She exhibits normal muscle tone. Coordination normal.  Skin: Skin is warm and dry. No rash noted. No erythema. No pallor.  Psychiatric: She has a normal mood and affect. Her behavior is  normal. Judgment and thought content normal.       Medications Ordered at today's visit: Meds ordered this encounter  Medications  . norgestimate-ethinyl estradiol (ORTHO-CYCLEN) 0.25-35 MG-MCG tablet    Sig: I tablet daily    Dispense:  1 Package    Refill:  12    Other orders placed at today's visit: No orders of the defined types were placed in this encounter.     Assessment:    Healthy female exam.    Plan:    Contraception: OCP (estrogen/progesterone). Follow up in: 1 year.     Return in about 1 year (around 07/09/2019) for yearly.

## 2018-07-09 NOTE — Addendum Note (Signed)
Addended by: Diona Fanti A on: 07/09/2018 04:46 PM   Modules accepted: Orders

## 2018-07-11 LAB — CYTOLOGY - PAP
Diagnosis: NEGATIVE
HPV: NOT DETECTED

## 2018-08-20 DIAGNOSIS — F419 Anxiety disorder, unspecified: Secondary | ICD-10-CM | POA: Diagnosis not present

## 2019-01-17 DIAGNOSIS — F419 Anxiety disorder, unspecified: Secondary | ICD-10-CM | POA: Diagnosis not present

## 2019-06-10 DIAGNOSIS — Z1389 Encounter for screening for other disorder: Secondary | ICD-10-CM | POA: Diagnosis not present

## 2019-06-10 DIAGNOSIS — F419 Anxiety disorder, unspecified: Secondary | ICD-10-CM | POA: Diagnosis not present

## 2019-06-10 DIAGNOSIS — Z681 Body mass index (BMI) 19 or less, adult: Secondary | ICD-10-CM | POA: Diagnosis not present

## 2019-06-10 DIAGNOSIS — Z0000 Encounter for general adult medical examination without abnormal findings: Secondary | ICD-10-CM | POA: Diagnosis not present

## 2019-07-18 ENCOUNTER — Telehealth: Payer: Self-pay | Admitting: Obstetrics & Gynecology

## 2019-07-18 MED ORDER — NORGESTIMATE-ETH ESTRADIOL 0.25-35 MG-MCG PO TABS: ORAL_TABLET | ORAL | 2 refills | Status: DC

## 2019-07-18 NOTE — Addendum Note (Signed)
Addended by: Cyril Mourning A on: 07/18/2019 01:45 PM   Modules accepted: Orders

## 2019-07-18 NOTE — Telephone Encounter (Signed)
Will refill OCs til appt.

## 2019-07-18 NOTE — Telephone Encounter (Signed)
Patient would like refill on her birth control sent to her pharmacy.

## 2019-09-13 ENCOUNTER — Other Ambulatory Visit: Payer: Self-pay | Admitting: Adult Health

## 2019-09-23 ENCOUNTER — Encounter: Payer: Self-pay | Admitting: Obstetrics & Gynecology

## 2019-09-23 ENCOUNTER — Ambulatory Visit (INDEPENDENT_AMBULATORY_CARE_PROVIDER_SITE_OTHER): Payer: BC Managed Care – PPO | Admitting: Obstetrics & Gynecology

## 2019-09-23 ENCOUNTER — Other Ambulatory Visit (HOSPITAL_COMMUNITY)
Admission: RE | Admit: 2019-09-23 | Discharge: 2019-09-23 | Disposition: A | Discharge status: Home / Self Care | Payer: BC Managed Care – PPO | Source: Ambulatory Visit | Attending: Obstetrics & Gynecology | Admitting: Obstetrics & Gynecology

## 2019-09-23 VITALS — BP 115/74 | HR 82 | Ht 62.0 in | Wt 103.0 lb

## 2019-09-23 DIAGNOSIS — Z01419 Encounter for gynecological examination (general) (routine) without abnormal findings: Secondary | ICD-10-CM | POA: Insufficient documentation

## 2019-09-23 MED ORDER — NORGESTIMATE-ETH ESTRADIOL 0.25-35 MG-MCG PO TABS: ORAL_TABLET | ORAL | 12 refills | Status: DC

## 2019-09-23 NOTE — Progress Notes (Signed)
Subjective:     Amy Hardin is a 34 y.o. female here for a routine exam.  Patient's last menstrual period was 09/16/2019 (approximate). G1P1 Birth Control Method:  OCP Menstrual Calendar(currently): regular  Current complaints: none.   Current acute medical issues:  none   Recent Gynecologic History Patient's last menstrual period was 09/16/2019 (approximate). Last Pap: 6/20,  normal Last mammogram: n/a,    Past Medical History:  Diagnosis Date  . Anxiety   . Contraceptive management 09/29/2014    History reviewed. No pertinent surgical history.  OB History    Gravida  1   Para  1   Term      Preterm      AB      Living  1     SAB      TAB      Ectopic      Multiple      Live Births  1           Social History   Socioeconomic History  . Marital status: Single    Spouse name: Not on file  . Number of children: 1  . Years of education: Not on file  . Highest education level: Not on file  Occupational History  . Not on file  Tobacco Use  . Smoking status: Current Every Day Smoker    Packs/day: 1.00    Years: 13.00    Pack years: 13.00    Types: Cigarettes  . Smokeless tobacco: Never Used  Substance and Sexual Activity  . Alcohol use: No  . Drug use: No  . Sexual activity: Yes    Birth control/protection: Pill  Other Topics Concern  . Not on file  Social History Narrative  . Not on file   Social Determinants of Health   Financial Resource Strain:   . Difficulty of Paying Living Expenses: Not on file  Food Insecurity:   . Worried About Programme researcher, broadcasting/film/video in the Last Year: Not on file  . Ran Out of Food in the Last Year: Not on file  Transportation Needs:   . Lack of Transportation (Medical): Not on file  . Lack of Transportation (Non-Medical): Not on file  Physical Activity:   . Days of Exercise per Week: Not on file  . Minutes of Exercise per Session: Not on file  Stress:   . Feeling of Stress : Not on file  Social  Connections:   . Frequency of Communication with Friends and Family: Not on file  . Frequency of Social Gatherings with Friends and Family: Not on file  . Attends Religious Services: Not on file  . Active Member of Clubs or Organizations: Not on file  . Attends Banker Meetings: Not on file  . Marital Status: Not on file    Family History  Problem Relation Age of Onset  . Cancer Maternal Grandmother   . Cancer Maternal Grandfather   . Diabetes Maternal Grandfather   . COPD Paternal Grandmother   . Congestive Heart Failure Paternal Grandfather   . Diabetes Paternal Grandfather   . Heart disease Paternal Grandfather   . Hypertension Paternal Grandfather      Current Outpatient Medications:  .  ALPRAZolam (XANAX) 0.5 MG tablet, TAKE 1 TABLET 3 TIMES DAILY AS NEEDED FOR ANXIETY, Disp: 90 tablet, Rfl: 4 .  norgestimate-ethinyl estradiol (ESTARYLLA) 0.25-35 MG-MCG tablet, TAKE ONE (1) TABLET EACH DAY, Disp: 28 tablet, Rfl: 12  Review of Systems  Review of  Systems  Constitutional: Negative for fever, chills, weight loss, malaise/fatigue and diaphoresis.  HENT: Negative for hearing loss, ear pain, nosebleeds, congestion, sore throat, neck pain, tinnitus and ear discharge.   Eyes: Negative for blurred vision, double vision, photophobia, pain, discharge and redness.  Respiratory: Negative for cough, hemoptysis, sputum production, shortness of breath, wheezing and stridor.   Cardiovascular: Negative for chest pain, palpitations, orthopnea, claudication, leg swelling and PND.  Gastrointestinal: negative for abdominal pain. Negative for heartburn, nausea, vomiting, diarrhea, constipation, blood in stool and melena.  Genitourinary: Negative for dysuria, urgency, frequency, hematuria and flank pain.  Musculoskeletal: Negative for myalgias, back pain, joint pain and falls.  Skin: Negative for itching and rash.  Neurological: Negative for dizziness, tingling, tremors, sensory  change, speech change, focal weakness, seizures, loss of consciousness, weakness and headaches.  Endo/Heme/Allergies: Negative for environmental allergies and polydipsia. Does not bruise/bleed easily.  Psychiatric/Behavioral: Negative for depression, suicidal ideas, hallucinations, memory loss and substance abuse. The patient is not nervous/anxious and does not have insomnia.        Objective:  Blood pressure 115/74, pulse 82, height 5\' 2"  (1.575 m), weight 103 lb (46.7 kg), last menstrual period 09/16/2019.   Physical Exam  Vitals reviewed. Constitutional: She is oriented to person, place, and time. She appears well-developed and well-nourished.  HENT:  Head: Normocephalic and atraumatic.        Right Ear: External ear normal.  Left Ear: External ear normal.  Nose: Nose normal.  Mouth/Throat: Oropharynx is clear and moist.  Eyes: Conjunctivae and EOM are normal. Pupils are equal, round, and reactive to light. Right eye exhibits no discharge. Left eye exhibits no discharge. No scleral icterus.  Neck: Normal range of motion. Neck supple. No tracheal deviation present. No thyromegaly present.  Cardiovascular: Normal rate, regular rhythm, normal heart sounds and intact distal pulses.  Exam reveals no gallop and no friction rub.   No murmur heard. Respiratory: Effort normal and breath sounds normal. No respiratory distress. She has no wheezes. She has no rales. She exhibits no tenderness.  GI: Soft. Bowel sounds are normal. She exhibits no distension and no mass. There is no tenderness. There is no rebound and no guarding.  Genitourinary:  Breasts no masses skin changes or nipple changes bilaterally      Vulva is normal without lesions Vagina is pink moist without discharge Cervix normal in appearance and pap is done Uterus is normal size shape and contour Adnexa is negative with normal sized ovaries   Musculoskeletal: Normal range of motion. She exhibits no edema and no tenderness.   Neurological: She is alert and oriented to person, place, and time. She has normal reflexes. She displays normal reflexes. No cranial nerve deficit. She exhibits normal muscle tone. Coordination normal.  Skin: Skin is warm and dry. No rash noted. No erythema. No pallor.  Psychiatric: She has a normal mood and affect. Her behavior is normal. Judgment and thought content normal.       Medications Ordered at today's visit: Meds ordered this encounter  Medications  . norgestimate-ethinyl estradiol (ESTARYLLA) 0.25-35 MG-MCG tablet    Sig: TAKE ONE (1) TABLET EACH DAY    Dispense:  28 tablet    Refill:  12    Other orders placed at today's visit: No orders of the defined types were placed in this encounter.     Assessment:    Normal Gyn exam.    Plan:    Contraception: OCP (estrogen/progesterone). Follow up in: 1 years.  Return in about 1 year (around 09/22/2020) for yearly, with Dr Despina Hidden.

## 2019-09-25 LAB — CYTOLOGY - PAP
Comment: NEGATIVE
Diagnosis: NEGATIVE
High risk HPV: NEGATIVE

## 2019-10-11 ENCOUNTER — Other Ambulatory Visit: Payer: Self-pay | Admitting: Adult Health

## 2019-10-14 ENCOUNTER — Other Ambulatory Visit: Payer: BC Managed Care – PPO

## 2019-10-14 ENCOUNTER — Other Ambulatory Visit: Payer: Self-pay

## 2019-10-14 ENCOUNTER — Other Ambulatory Visit: Payer: Self-pay | Admitting: *Deleted

## 2019-10-14 DIAGNOSIS — Z20822 Contact with and (suspected) exposure to covid-19: Secondary | ICD-10-CM

## 2019-10-16 LAB — SPECIMEN STATUS REPORT

## 2019-10-16 LAB — NOVEL CORONAVIRUS, NAA: SARS-CoV-2, NAA: NOT DETECTED

## 2019-10-16 LAB — SARS-COV-2, NAA 2 DAY TAT

## 2019-10-28 DIAGNOSIS — F419 Anxiety disorder, unspecified: Secondary | ICD-10-CM | POA: Diagnosis not present

## 2019-10-28 DIAGNOSIS — E7849 Other hyperlipidemia: Secondary | ICD-10-CM | POA: Diagnosis not present

## 2019-11-08 ENCOUNTER — Other Ambulatory Visit: Payer: Self-pay | Admitting: Adult Health

## 2019-11-26 ENCOUNTER — Telehealth: Payer: Self-pay | Admitting: Adult Health

## 2019-11-26 NOTE — Telephone Encounter (Signed)
Pt calling, pharmacy told her they didn't receive the refills on her [facility]  ESTARYLLA 0.25-35 MG-MCG tablet  Please advise & call pt   [facility], [place]

## 2019-11-26 NOTE — Telephone Encounter (Signed)
I called [facility] and they have pt's birth control. They will fill prescription and pt can pick it up later. Pt aware and voiced understanding. JSY

## 2020-01-14 IMAGING — DX DG CHEST 2V
2 series · 2 of 2 positions shown · non-contrast
Comparison: None available currently.

CLINICAL DATA: Dyspnea.

EXAM:
CHEST - 2 VIEW

[chest pa]
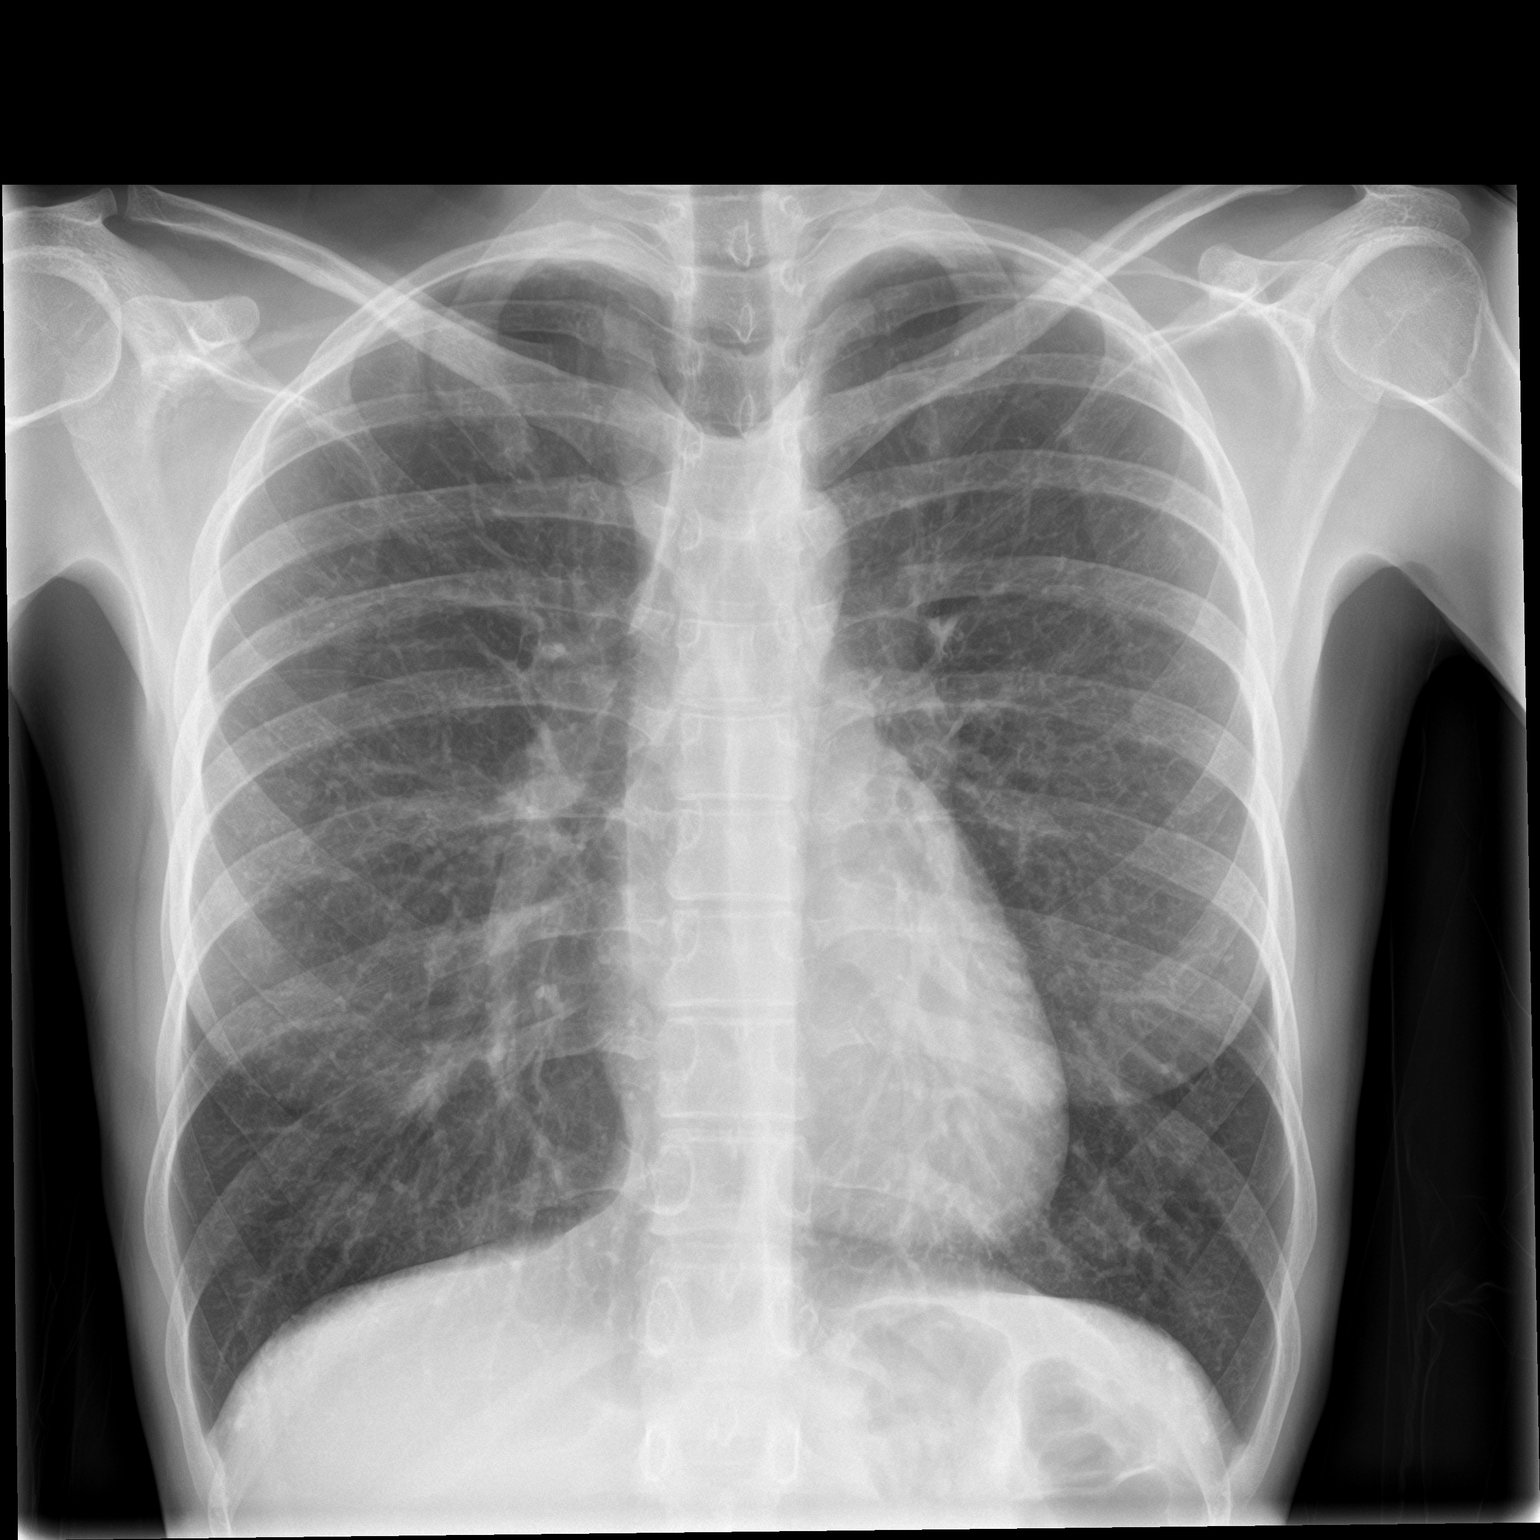

[chest lat]
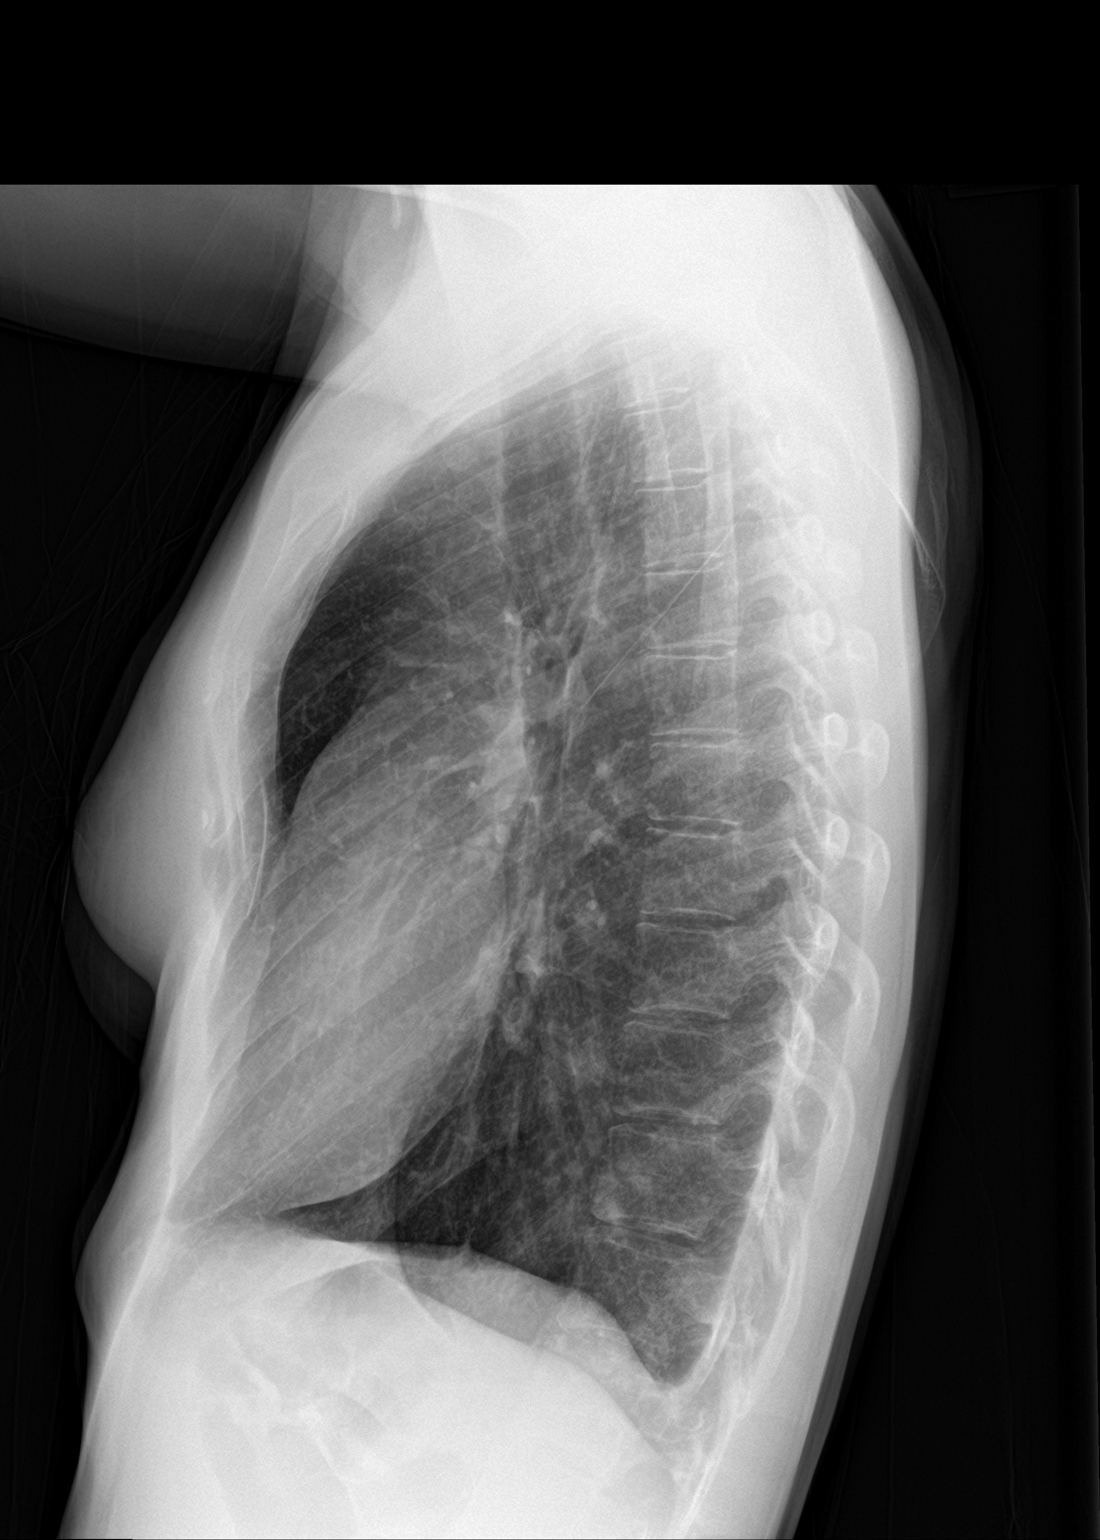

[2 of 2 positions shown; findings below may reference images not displayed]

FINDINGS: The heart size and mediastinal contours are within normal limits.
Both lungs are clear. No pneumothorax or pleural effusion is noted.
The visualized skeletal structures are unremarkable.
IMPRESSION: No active cardiopulmonary disease.

## 2020-03-09 DIAGNOSIS — F419 Anxiety disorder, unspecified: Secondary | ICD-10-CM | POA: Diagnosis not present

## 2020-03-09 DIAGNOSIS — F909 Attention-deficit hyperactivity disorder, unspecified type: Secondary | ICD-10-CM | POA: Diagnosis not present

## 2020-07-05 DIAGNOSIS — Z1331 Encounter for screening for depression: Secondary | ICD-10-CM | POA: Diagnosis not present

## 2020-07-05 DIAGNOSIS — Z681 Body mass index (BMI) 19 or less, adult: Secondary | ICD-10-CM | POA: Diagnosis not present

## 2020-07-05 DIAGNOSIS — F909 Attention-deficit hyperactivity disorder, unspecified type: Secondary | ICD-10-CM | POA: Diagnosis not present

## 2020-07-05 DIAGNOSIS — F419 Anxiety disorder, unspecified: Secondary | ICD-10-CM | POA: Diagnosis not present

## 2020-07-30 DIAGNOSIS — D72829 Elevated white blood cell count, unspecified: Secondary | ICD-10-CM | POA: Diagnosis not present

## 2020-07-30 DIAGNOSIS — F419 Anxiety disorder, unspecified: Secondary | ICD-10-CM | POA: Diagnosis not present

## 2020-07-30 DIAGNOSIS — F909 Attention-deficit hyperactivity disorder, unspecified type: Secondary | ICD-10-CM | POA: Diagnosis not present

## 2020-07-30 DIAGNOSIS — Z0000 Encounter for general adult medical examination without abnormal findings: Secondary | ICD-10-CM | POA: Diagnosis not present

## 2020-07-30 DIAGNOSIS — Z681 Body mass index (BMI) 19 or less, adult: Secondary | ICD-10-CM | POA: Diagnosis not present

## 2020-10-27 DIAGNOSIS — F419 Anxiety disorder, unspecified: Secondary | ICD-10-CM | POA: Diagnosis not present

## 2020-10-27 DIAGNOSIS — F988 Other specified behavioral and emotional disorders with onset usually occurring in childhood and adolescence: Secondary | ICD-10-CM | POA: Diagnosis not present

## 2020-11-06 ENCOUNTER — Other Ambulatory Visit: Payer: Self-pay | Admitting: Adult Health

## 2020-11-30 ENCOUNTER — Other Ambulatory Visit: Payer: Self-pay | Admitting: Obstetrics & Gynecology

## 2020-11-30 ENCOUNTER — Telehealth: Payer: Self-pay | Admitting: Obstetrics & Gynecology

## 2020-11-30 MED ORDER — NORGESTIMATE-ETH ESTRADIOL 0.25-35 MG-MCG PO TABS: ORAL_TABLET | ORAL | 12 refills | Status: DC

## 2020-11-30 NOTE — Telephone Encounter (Signed)
Patient is calling stating that her birth control runs out Saturday 12/04/20 we have scheduled her appointment for 01/07/21 with Dr. Despina Hidden but pt will need to refills to get to that date. Are we able to send script to the drug store [place]

## 2020-11-30 NOTE — Telephone Encounter (Signed)
Pt has an appt 12/9 but is going to run out of birth control. Can you send enough in to last until her appt? Thanks!! JSY

## 2020-11-30 NOTE — Telephone Encounter (Signed)
Refilled as requested  

## 2021-01-07 ENCOUNTER — Encounter: Payer: Self-pay | Admitting: Obstetrics & Gynecology

## 2021-01-07 ENCOUNTER — Ambulatory Visit (INDEPENDENT_AMBULATORY_CARE_PROVIDER_SITE_OTHER): Payer: BC Managed Care – PPO | Admitting: Obstetrics & Gynecology

## 2021-01-07 ENCOUNTER — Other Ambulatory Visit: Payer: Self-pay

## 2021-01-07 VITALS — BP 104/76 | HR 78 | Ht 64.0 in | Wt 103.0 lb

## 2021-01-07 DIAGNOSIS — Z01419 Encounter for gynecological examination (general) (routine) without abnormal findings: Secondary | ICD-10-CM

## 2021-01-07 MED ORDER — NORETHINDRONE 0.35 MG PO TABS: 1.0000 | ORAL_TABLET | Freq: Every day | ORAL | 11 refills | Status: DC

## 2021-01-07 NOTE — Progress Notes (Signed)
Subjective:     Amy Hardin is a 35 y.o. female here for a routine exam.  Patient's last menstrual period was 12/30/2020 (exact date). G1P1 Birth Control Method:  COC Menstrual Calendar(currently): regular  Current complaints: none.   Current acute medical issues:  non   Recent Gynecologic History Patient's last menstrual period was 12/30/2020 (exact date). Last Pap: 2021,  normal Last mammogram: n/a,    Past Medical History:  Diagnosis Date   ADD (attention deficit disorder)    Anxiety    Contraceptive management 09/29/2014    History reviewed. No pertinent surgical history.  OB History     Gravida  1   Para  1   Term      Preterm      AB      Living  1      SAB      IAB      Ectopic      Multiple      Live Births  1           Social History   Socioeconomic History   Marital status: Single    Spouse name: Not on file   Number of children: 1   Years of education: Not on file   Highest education level: Not on file  Occupational History   Not on file  Tobacco Use   Smoking status: Every Day    Packs/day: 1.00    Years: 13.00    Pack years: 13.00    Types: Cigarettes   Smokeless tobacco: Never  Vaping Use   Vaping Use: Never used  Substance and Sexual Activity   Alcohol use: No   Drug use: No   Sexual activity: Yes    Birth control/protection: Pill  Other Topics Concern   Not on file  Social History Narrative   Not on file   Social Determinants of Health   Financial Resource Strain: Low Risk    Difficulty of Paying Living Expenses: Not hard at all  Food Insecurity: No Food Insecurity   Worried About Charity fundraiser in the Last Year: Never true   Ran Out of Food in the Last Year: Never true  Transportation Needs: No Transportation Needs   Lack of Transportation (Medical): No   Lack of Transportation (Non-Medical): No  Physical Activity: Insufficiently Active   Days of Exercise per Week: 1 day   Minutes of Exercise  per Session: 10 min  Stress: Stress Concern Present   Feeling of Stress : To some extent  Social Connections: Unknown   Frequency of Communication with Friends and Family: Patient refused   Frequency of Social Gatherings with Friends and Family: Patient refused   Attends Religious Services: Never   Marine scientist or Organizations: No   Attends Music therapist: Never   Marital Status: Married    Family History  Problem Relation Age of Onset   Congestive Heart Failure Paternal Grandfather    Diabetes Paternal Grandfather    Heart disease Paternal Grandfather    Hypertension Paternal Grandfather    COPD Paternal Grandmother    Cancer Maternal Grandmother    Cancer Maternal Grandfather    Diabetes Maternal Grandfather      Current Outpatient Medications:    ALPRAZolam (XANAX) 1 MG tablet, Take 1 mg by mouth 4 (four) times daily as needed., Disp: , Rfl:    amphetamine-dextroamphetamine (ADDERALL) 20 MG tablet, Take 20 mg by mouth 2 (two) times daily., Disp: ,  Rfl:    norethindrone (MICRONOR) 0.35 MG tablet, Take 1 tablet (0.35 mg total) by mouth daily., Disp: 28 tablet, Rfl: 11   norgestimate-ethinyl estradiol (ESTARYLLA) 0.25-35 MG-MCG tablet, TAKE ONE (1) TABLET EACH DAY, Disp: 28 tablet, Rfl: 12  Review of Systems  Review of Systems  Constitutional: Negative for fever, chills, weight loss, malaise/fatigue and diaphoresis.  HENT: Negative for hearing loss, ear pain, nosebleeds, congestion, sore throat, neck pain, tinnitus and ear discharge.   Eyes: Negative for blurred vision, double vision, photophobia, pain, discharge and redness.  Respiratory: Negative for cough, hemoptysis, sputum production, shortness of breath, wheezing and stridor.   Cardiovascular: Negative for chest pain, palpitations, orthopnea, claudication, leg swelling and PND.  Gastrointestinal: negative for abdominal pain. Negative for heartburn, nausea, vomiting, diarrhea, constipation, blood  in stool and melena.  Genitourinary: Negative for dysuria, urgency, frequency, hematuria and flank pain.  Musculoskeletal: Negative for myalgias, back pain, joint pain and falls.  Skin: Negative for itching and rash.  Neurological: Negative for dizziness, tingling, tremors, sensory change, speech change, focal weakness, seizures, loss of consciousness, weakness and headaches.  Endo/Heme/Allergies: Negative for environmental allergies and polydipsia. Does not bruise/bleed easily.  Psychiatric/Behavioral: Negative for depression, suicidal ideas, hallucinations, memory loss and substance abuse. The patient is not nervous/anxious and does not have insomnia.        Objective:  Blood pressure 104/76, pulse 78, height 5\' 4"  (1.626 m), weight 103 lb (46.7 kg), last menstrual period 12/30/2020.   Physical Exam  Vitals reviewed. Constitutional: She is oriented to person, place, and time. She appears well-developed and well-nourished.  HENT:  Head: Normocephalic and atraumatic.        Right Ear: External ear normal.  Left Ear: External ear normal.  Nose: Nose normal.  Mouth/Throat: Oropharynx is clear and moist.  Eyes: Conjunctivae and EOM are normal. Pupils are equal, round, and reactive to light. Right eye exhibits no discharge. Left eye exhibits no discharge. No scleral icterus.  Neck: Normal range of motion. Neck supple. No tracheal deviation present. No thyromegaly present.  Cardiovascular: Normal rate, regular rhythm, normal heart sounds and intact distal pulses.  Exam reveals no gallop and no friction rub.   No murmur heard. Respiratory: Effort normal and breath sounds normal. No respiratory distress. She has no wheezes. She has no rales. She exhibits no tenderness.  GI: Soft. Bowel sounds are normal. She exhibits no distension and no mass. There is no tenderness. There is no rebound and no guarding.  Genitourinary:  Breasts no masses skin changes or nipple changes bilaterally      Vulva is  normal without lesions Vagina is pink moist without discharge Cervix normal in appearance and pap is done Uterus is normal size shape and contour Adnexa is negative with normal sized ovaries   Musculoskeletal: Normal range of motion. She exhibits no edema and no tenderness.  Neurological: She is alert and oriented to person, place, and time. She has normal reflexes. She displays normal reflexes. No cranial nerve deficit. She exhibits normal muscle tone. Coordination normal.  Skin: Skin is warm and dry. No rash noted. No erythema. No pallor.  Psychiatric: She has a normal mood and affect. Her behavior is normal. Judgment and thought content normal.       Medications Ordered at today's visit: Meds ordered this encounter  Medications   norethindrone (MICRONOR) 0.35 MG tablet    Sig: Take 1 tablet (0.35 mg total) by mouth daily.    Dispense:  28 tablet  Refill:  11    Other orders placed at today's visit: No orders of the defined types were placed in this encounter.     Assessment:    Normal Gyn exam.    Plan:    Contraception: switch to POP due to smoking 1 ppd. Follow up in: 1 year.     Return in about 1 year (around 01/07/2022) for yearly.

## 2021-01-11 DIAGNOSIS — F419 Anxiety disorder, unspecified: Secondary | ICD-10-CM | POA: Diagnosis not present

## 2021-01-11 DIAGNOSIS — F988 Other specified behavioral and emotional disorders with onset usually occurring in childhood and adolescence: Secondary | ICD-10-CM | POA: Diagnosis not present

## 2021-04-15 DIAGNOSIS — F988 Other specified behavioral and emotional disorders with onset usually occurring in childhood and adolescence: Secondary | ICD-10-CM | POA: Diagnosis not present

## 2021-04-15 DIAGNOSIS — Z681 Body mass index (BMI) 19 or less, adult: Secondary | ICD-10-CM | POA: Diagnosis not present

## 2021-04-15 DIAGNOSIS — F419 Anxiety disorder, unspecified: Secondary | ICD-10-CM | POA: Diagnosis not present

## 2021-04-15 DIAGNOSIS — T148XXA Other injury of unspecified body region, initial encounter: Secondary | ICD-10-CM | POA: Diagnosis not present

## 2021-07-07 DIAGNOSIS — D229 Melanocytic nevi, unspecified: Secondary | ICD-10-CM | POA: Diagnosis not present

## 2021-07-07 DIAGNOSIS — Z1331 Encounter for screening for depression: Secondary | ICD-10-CM | POA: Diagnosis not present

## 2021-07-07 DIAGNOSIS — F988 Other specified behavioral and emotional disorders with onset usually occurring in childhood and adolescence: Secondary | ICD-10-CM | POA: Diagnosis not present

## 2021-07-07 DIAGNOSIS — Z0001 Encounter for general adult medical examination with abnormal findings: Secondary | ICD-10-CM | POA: Diagnosis not present

## 2021-07-07 DIAGNOSIS — Z681 Body mass index (BMI) 19 or less, adult: Secondary | ICD-10-CM | POA: Diagnosis not present

## 2021-09-26 DIAGNOSIS — F988 Other specified behavioral and emotional disorders with onset usually occurring in childhood and adolescence: Secondary | ICD-10-CM | POA: Diagnosis not present

## 2021-09-26 DIAGNOSIS — F419 Anxiety disorder, unspecified: Secondary | ICD-10-CM | POA: Diagnosis not present

## 2021-10-17 DIAGNOSIS — B349 Viral infection, unspecified: Secondary | ICD-10-CM | POA: Diagnosis not present

## 2021-10-17 DIAGNOSIS — R509 Fever, unspecified: Secondary | ICD-10-CM | POA: Diagnosis not present

## 2021-10-17 DIAGNOSIS — H6982 Other specified disorders of Eustachian tube, left ear: Secondary | ICD-10-CM | POA: Diagnosis not present

## 2021-10-17 DIAGNOSIS — R059 Cough, unspecified: Secondary | ICD-10-CM | POA: Diagnosis not present

## 2021-12-29 ENCOUNTER — Other Ambulatory Visit: Payer: Self-pay | Admitting: Obstetrics & Gynecology

## 2021-12-29 DIAGNOSIS — F419 Anxiety disorder, unspecified: Secondary | ICD-10-CM | POA: Diagnosis not present

## 2021-12-29 DIAGNOSIS — F988 Other specified behavioral and emotional disorders with onset usually occurring in childhood and adolescence: Secondary | ICD-10-CM | POA: Diagnosis not present

## 2021-12-30 ENCOUNTER — Encounter: Payer: Self-pay | Admitting: Obstetrics & Gynecology

## 2022-01-02 MED ORDER — NORETHINDRONE 0.35 MG PO TABS: 1.0000 | ORAL_TABLET | Freq: Every day | ORAL | 12 refills | Status: DC

## 2022-01-26 ENCOUNTER — Encounter: Payer: Self-pay | Admitting: Obstetrics & Gynecology

## 2022-01-26 ENCOUNTER — Ambulatory Visit (INDEPENDENT_AMBULATORY_CARE_PROVIDER_SITE_OTHER): Payer: BC Managed Care – PPO | Admitting: Obstetrics & Gynecology

## 2022-01-26 VITALS — BP 118/74 | HR 85 | Ht 64.0 in | Wt 106.0 lb

## 2022-01-26 DIAGNOSIS — Z01419 Encounter for gynecological examination (general) (routine) without abnormal findings: Secondary | ICD-10-CM

## 2022-01-26 DIAGNOSIS — Z3041 Encounter for surveillance of contraceptive pills: Secondary | ICD-10-CM

## 2022-01-26 NOTE — Progress Notes (Signed)
Subjective:     Amy Hardin is a 36 y.o. female here for a routine exam.  No LMP recorded. (Menstrual status: Oral contraceptives). G1P1 Birth Control Method:  POP Menstrual Calendar(currently): some missed periods  Current complaints: none.   Current acute medical issues:  none   Recent Gynecologic History No LMP recorded. (Menstrual status: Oral contraceptives). Last Pap: 8/21,  normal Last mammogram: never,    Past Medical History:  Diagnosis Date   ADD (attention deficit disorder)    Anxiety    Contraceptive management 09/29/2014    History reviewed. No pertinent surgical history.  OB History     Gravida  1   Para  1   Term      Preterm      AB      Living  1      SAB      IAB      Ectopic      Multiple      Live Births  1           Social History   Socioeconomic History   Marital status: Married    Spouse name: Not on file   Number of children: 1   Years of education: Not on file   Highest education level: Not on file  Occupational History   Not on file  Tobacco Use   Smoking status: Every Day    Packs/day: 1.00    Years: 13.00    Total pack years: 13.00    Types: Cigarettes   Smokeless tobacco: Never  Vaping Use   Vaping Use: Never used  Substance and Sexual Activity   Alcohol use: No   Drug use: No   Sexual activity: Yes    Birth control/protection: Pill  Other Topics Concern   Not on file  Social History Narrative   Not on file   Social Determinants of Health   Financial Resource Strain: Low Risk  (01/26/2022)   Overall Financial Resource Strain (CARDIA)    Difficulty of Paying Living Expenses: Not very hard  Food Insecurity: No Food Insecurity (01/26/2022)   Hunger Vital Sign    Worried About Running Out of Food in the Last Year: Never true    Ran Out of Food in the Last Year: Never true  Transportation Needs: No Transportation Needs (01/26/2022)   PRAPARE - Administrator, Civil Service (Medical):  No    Lack of Transportation (Non-Medical): No  Physical Activity: Insufficiently Active (01/26/2022)   Exercise Vital Sign    Days of Exercise per Week: 2 days    Minutes of Exercise per Session: 20 min  Stress: Stress Concern Present (01/26/2022)   [employer] of Occupational Health - Occupational Stress Questionnaire    Feeling of Stress : Very much  Social Connections: Moderately Isolated (01/26/2022)   Social Connection and Isolation Panel [NHANES]    Frequency of Communication with Friends and Family: More than three times a week    Frequency of Social Gatherings with Friends and Family: Twice a week    Attends Religious Services: Never    Database administrator or Organizations: No    Attends Engineer, structural: Never    Marital Status: Married    Family History  Problem Relation Age of Onset   Congestive Heart Failure Paternal Grandfather    Diabetes Paternal Grandfather    Heart disease Paternal Grandfather    Hypertension Paternal Grandfather    COPD Paternal Grandmother  Cancer Maternal Grandmother    Cancer Maternal Grandfather    Diabetes Maternal Grandfather    Sarcoidosis Mother    Depression Brother    Anxiety disorder Brother      Current Outpatient Medications:    ALPRAZolam (XANAX) 1 MG tablet, Take 1 mg by mouth 4 (four) times daily as needed., Disp: , Rfl:    amphetamine-dextroamphetamine (ADDERALL) 20 MG tablet, Take 20 mg by mouth 2 (two) times daily., Disp: , Rfl:    norethindrone (MICRONOR) 0.35 MG tablet, Take 1 tablet (0.35 mg total) by mouth daily., Disp: 28 tablet, Rfl: 12  Review of Systems  Review of Systems  Constitutional: Negative for fever, chills, weight loss, malaise/fatigue and diaphoresis.  HENT: Negative for hearing loss, ear pain, nosebleeds, congestion, sore throat, neck pain, tinnitus and ear discharge.   Eyes: Negative for blurred vision, double vision, photophobia, pain, discharge and redness.  Respiratory:  Negative for cough, hemoptysis, sputum production, shortness of breath, wheezing and stridor.   Cardiovascular: Negative for chest pain, palpitations, orthopnea, claudication, leg swelling and PND.  Gastrointestinal: negative for abdominal pain. Negative for heartburn, nausea, vomiting, diarrhea, constipation, blood in stool and melena.  Genitourinary: Negative for dysuria, urgency, frequency, hematuria and flank pain.  Musculoskeletal: Negative for myalgias, back pain, joint pain and falls.  Skin: Negative for itching and rash.  Neurological: Negative for dizziness, tingling, tremors, sensory change, speech change, focal weakness, seizures, loss of consciousness, weakness and headaches.  Endo/Heme/Allergies: Negative for environmental allergies and polydipsia. Does not bruise/bleed easily.  Psychiatric/Behavioral: Negative for depression, suicidal ideas, hallucinations, memory loss and substance abuse. The patient is not nervous/anxious and does not have insomnia.        Objective:  Blood pressure 118/74, pulse 85, height 5\' 4"  (1.626 m), weight 106 lb (48.1 kg).   Physical Exam  Vitals reviewed. Constitutional: She is oriented to person, place, and time. She appears well-developed and well-nourished.  HENT:  Head: Normocephalic and atraumatic.        Right Ear: External ear normal.  Left Ear: External ear normal.  Nose: Nose normal.  Mouth/Throat: Oropharynx is clear and moist.  Eyes: Conjunctivae and EOM are normal. Pupils are equal, round, and reactive to light. Right eye exhibits no discharge. Left eye exhibits no discharge. No scleral icterus.  Neck: Normal range of motion. Neck supple. No tracheal deviation present. No thyromegaly present.  Cardiovascular: Normal rate, regular rhythm, normal heart sounds and intact distal pulses.  Exam reveals no gallop and no friction rub.   No murmur heard. Respiratory: Effort normal and breath sounds normal. No respiratory distress. She has no  wheezes. She has no rales. She exhibits no tenderness.  GI: Soft. Bowel sounds are normal. She exhibits no distension and no mass. There is no tenderness. There is no rebound and no guarding.  Genitourinary:  Breasts no masses skin changes or nipple changes bilaterally      Vulva is normal without lesions Vagina is pink moist without discharge Cervix normal in appearance and pap is not done Uterus is normal size shape and contour Adnexa is negative with normal sized ovaries   Musculoskeletal: Normal range of motion. She exhibits no edema and no tenderness.  Neurological: She is alert and oriented to person, place, and time. She has normal reflexes. She displays normal reflexes. No cranial nerve deficit. She exhibits normal muscle tone. Coordination normal.  Skin: Skin is warm and dry. No rash noted. No erythema. No pallor.  Psychiatric: She has a normal  mood and affect. Her behavior is normal. Judgment and thought content normal.       Medications Ordered at today's visit: No orders of the defined types were placed in this encounter.   Other orders placed at today's visit: No orders of the defined types were placed in this encounter.     Assessment:    Normal Gyn exam.    Plan:    Contraception: oral progesterone-only contraceptive. Follow up in: 1 year.     Return in about 1 year (around 01/27/2023) for yearly.

## 2022-12-04 ENCOUNTER — Other Ambulatory Visit: Payer: Self-pay | Admitting: Obstetrics & Gynecology

## 2023-02-19 ENCOUNTER — Encounter: Payer: Self-pay | Admitting: Obstetrics & Gynecology

## 2023-02-19 ENCOUNTER — Other Ambulatory Visit (HOSPITAL_COMMUNITY)
Admission: RE | Admit: 2023-02-19 | Discharge: 2023-02-19 | Disposition: A | Discharge status: Home / Self Care | Payer: No Typology Code available for payment source | Source: Ambulatory Visit | Attending: Obstetrics & Gynecology | Admitting: Obstetrics & Gynecology

## 2023-02-19 ENCOUNTER — Ambulatory Visit: Payer: No Typology Code available for payment source | Admitting: Obstetrics & Gynecology

## 2023-02-19 VITALS — BP 116/77 | HR 107 | Ht 63.5 in | Wt 105.0 lb

## 2023-02-19 DIAGNOSIS — Z01419 Encounter for gynecological examination (general) (routine) without abnormal findings: Secondary | ICD-10-CM

## 2023-02-19 DIAGNOSIS — Z1151 Encounter for screening for human papillomavirus (HPV): Secondary | ICD-10-CM

## 2023-02-19 MED ORDER — NORETHINDRONE 0.35 MG PO TABS: 1.0000 | ORAL_TABLET | Freq: Every day | ORAL | 3 refills | Status: DC

## 2023-02-19 NOTE — Progress Notes (Signed)
Subjective:     Amy Hardin is a 38 y.o. female here for a routine exam.  Patient's last menstrual period was 02/12/2023. G1P1 Birth Control Method:  Micronor Menstrual Calendar(currently): intermittent  Current complaints: none.   Current acute medical issues:  none   Recent Gynecologic History Patient's last menstrual period was 02/12/2023. Last Pap: 03/2019,  normal Last mammogram: na,    Past Medical History:  Diagnosis Date   ADD (attention deficit disorder)    Anxiety    Contraceptive management 09/29/2014    History reviewed. No pertinent surgical history.  OB History     Gravida  1   Para  1   Term      Preterm      AB      Living  1      SAB      IAB      Ectopic      Multiple      Live Births  1           Social History   Socioeconomic History   Marital status: Married    Spouse name: Not on file   Number of children: 1   Years of education: Not on file   Highest education level: Not on file  Occupational History   Not on file  Tobacco Use   Smoking status: Every Day    Current packs/day: 1.00    Average packs/day: 1 pack/day for 13.0 years (13.0 ttl pk-yrs)    Types: Cigarettes   Smokeless tobacco: Never  Vaping Use   Vaping status: Never Used  Substance and Sexual Activity   Alcohol use: No   Drug use: No   Sexual activity: Yes    Birth control/protection: Pill  Other Topics Concern   Not on file  Social History Narrative   Not on file   Social Drivers of Health   Financial Resource Strain: Low Risk  (02/19/2023)   Overall Financial Resource Strain (CARDIA)    Difficulty of Paying Living Expenses: Not very hard  Food Insecurity: Patient Declined (02/19/2023)   Hunger Vital Sign    Worried About Running Out of Food in the Last Year: Patient declined    Ran Out of Food in the Last Year: Patient declined  Transportation Needs: No Transportation Needs (02/19/2023)   PRAPARE - Scientist, research (physical sciences) (Medical): No    Lack of Transportation (Non-Medical): No  Physical Activity: Insufficiently Active (02/19/2023)   Exercise Vital Sign    Days of Exercise per Week: 2 days    Minutes of Exercise per Session: 20 min  Stress: Stress Concern Present (02/19/2023)   [employer] of Occupational Health - Occupational Stress Questionnaire    Feeling of Stress : Very much  Social Connections: Moderately Isolated (02/19/2023)   Social Connection and Isolation Panel [NHANES]    Frequency of Communication with Friends and Family: More than three times a week    Frequency of Social Gatherings with Friends and Family: Once a week    Attends Religious Services: Never    Database administrator or Organizations: No    Attends Engineer, structural: Never    Marital Status: Married    Family History  Problem Relation Age of Onset   Congestive Heart Failure Paternal Grandfather    Diabetes Paternal Grandfather    Heart disease Paternal Grandfather    Hypertension Paternal Grandfather    COPD Paternal Grandmother  Cancer Maternal Grandmother    Cancer Maternal Grandfather    Diabetes Maternal Grandfather    Sarcoidosis Mother    Depression Brother    Anxiety disorder Brother      Current Outpatient Medications:    ALPRAZolam (XANAX) 1 MG tablet, Take 1 mg by mouth 4 (four) times daily as needed., Disp: , Rfl:    amphetamine-dextroamphetamine (ADDERALL) 20 MG tablet, Take 20 mg by mouth 2 (two) times daily., Disp: , Rfl:    norethindrone (MICRONOR) 0.35 MG tablet, Take 1 tablet (0.35 mg total) by mouth daily., Disp: 84 tablet, Rfl: 3  Review of Systems  Review of Systems  Constitutional: Negative for fever, chills, weight loss, malaise/fatigue and diaphoresis.  HENT: Negative for hearing loss, ear pain, nosebleeds, congestion, sore throat, neck pain, tinnitus and ear discharge.   Eyes: Negative for blurred vision, double vision, photophobia, pain, discharge and  redness.  Respiratory: Negative for cough, hemoptysis, sputum production, shortness of breath, wheezing and stridor.   Cardiovascular: Negative for chest pain, palpitations, orthopnea, claudication, leg swelling and PND.  Gastrointestinal: negative for abdominal pain. Negative for heartburn, nausea, vomiting, diarrhea, constipation, blood in stool and melena.  Genitourinary: Negative for dysuria, urgency, frequency, hematuria and flank pain.  Musculoskeletal: Negative for myalgias, back pain, joint pain and falls.  Skin: Negative for itching and rash.  Neurological: Negative for dizziness, tingling, tremors, sensory change, speech change, focal weakness, seizures, loss of consciousness, weakness and headaches.  Endo/Heme/Allergies: Negative for environmental allergies and polydipsia. Does not bruise/bleed easily.  Psychiatric/Behavioral: Negative for depression, suicidal ideas, hallucinations, memory loss and substance abuse. The patient is not nervous/anxious and does not have insomnia.        Objective:  Blood pressure 116/77, pulse (!) 107, height 5' 3.5" (1.613 m), weight 105 lb (47.6 kg), last menstrual period 02/12/2023.   Physical Exam  Vitals reviewed. Constitutional: She is oriented to person, place, and time. She appears well-developed and well-nourished.  HENT:  Head: Normocephalic and atraumatic.        Right Ear: External ear normal.  Left Ear: External ear normal.  Nose: Nose normal.  Mouth/Throat: Oropharynx is clear and moist.  Eyes: Conjunctivae and EOM are normal. Pupils are equal, round, and reactive to light. Right eye exhibits no discharge. Left eye exhibits no discharge. No scleral icterus.  Neck: Normal range of motion. Neck supple. No tracheal deviation present. No thyromegaly present.  Cardiovascular: Normal rate, regular rhythm, normal heart sounds and intact distal pulses.  Exam reveals no gallop and no friction rub.   No murmur heard. Respiratory: Effort normal  and breath sounds normal. No respiratory distress. She has no wheezes. She has no rales. She exhibits no tenderness.  GI: Soft. Bowel sounds are normal. She exhibits no distension and no mass. There is no tenderness. There is no rebound and no guarding.  Genitourinary:  Breasts no masses skin changes or nipple changes bilaterally      Vulva is normal without lesions Vagina is pink moist without discharge Cervix normal in appearance and pap is done Uterus is normal size shape and contour Adnexa is negative with normal sized ovaries   Musculoskeletal: Normal range of motion. She exhibits no edema and no tenderness.  Neurological: She is alert and oriented to person, place, and time. She has normal reflexes. She displays normal reflexes. No cranial nerve deficit. She exhibits normal muscle tone. Coordination normal.  Skin: Skin is warm and dry. No rash noted. No erythema. No pallor.  Psychiatric:  She has a normal mood and affect. Her behavior is normal. Judgment and thought content normal.       Medications Ordered at today's visit: Meds ordered this encounter  Medications   norethindrone (MICRONOR) 0.35 MG tablet    Sig: Take 1 tablet (0.35 mg total) by mouth daily.    Dispense:  84 tablet    Refill:  3    Other orders placed at today's visit: No orders of the defined types were placed in this encounter.    ASSESSMENT + PLAN:    ICD-10-CM   1. Well woman exam with routine gynecological exam  Z01.419     2. Encounter for gynecological examination with Papanicolaou smear of cervix  Z01.419 Cytology - PAP( [place])          Return in about 1 year (around 02/19/2024) for yearly.

## 2023-02-22 ENCOUNTER — Encounter: Payer: Self-pay | Admitting: Obstetrics & Gynecology

## 2023-02-22 LAB — CYTOLOGY - PAP
Comment: NEGATIVE
Diagnosis: NEGATIVE
High risk HPV: NEGATIVE

## 2024-01-18 ENCOUNTER — Other Ambulatory Visit: Payer: Self-pay | Admitting: Obstetrics & Gynecology
